# Patient Record
Sex: Female | Born: 1935 | Race: White | Hispanic: No | Marital: Married | Smoking: Never smoker
Health system: Southern US, Community
[De-identification: ages and names within clinical notes are randomized; demographics above are authoritative.]

## PROBLEM LIST (undated history)

## (undated) DIAGNOSIS — I10 Essential (primary) hypertension: Secondary | ICD-10-CM

## (undated) DIAGNOSIS — F0390 Unspecified dementia without behavioral disturbance: Secondary | ICD-10-CM

## (undated) HISTORY — PX: ABDOMINAL HYSTERECTOMY: SHX81

---

## 2003-10-22 ENCOUNTER — Other Ambulatory Visit: Payer: Self-pay

## 2005-02-03 ENCOUNTER — Ambulatory Visit: Payer: Self-pay | Admitting: Family Medicine

## 2005-05-02 ENCOUNTER — Other Ambulatory Visit: Payer: Self-pay

## 2005-05-02 ENCOUNTER — Emergency Department: Payer: Self-pay | Admitting: Unknown Physician Specialty

## 2007-04-15 ENCOUNTER — Other Ambulatory Visit: Payer: Self-pay

## 2007-04-15 ENCOUNTER — Inpatient Hospital Stay: Payer: Self-pay | Admitting: Unknown Physician Specialty

## 2007-06-26 ENCOUNTER — Ambulatory Visit: Payer: Self-pay | Admitting: Family Medicine

## 2008-09-11 ENCOUNTER — Ambulatory Visit: Payer: Self-pay | Admitting: Family Medicine

## 2008-10-02 ENCOUNTER — Ambulatory Visit: Payer: Self-pay | Admitting: Family Medicine

## 2008-10-22 ENCOUNTER — Ambulatory Visit: Payer: Self-pay | Admitting: Family Medicine

## 2010-02-25 ENCOUNTER — Ambulatory Visit: Payer: Self-pay | Admitting: Family Medicine

## 2011-05-25 ENCOUNTER — Ambulatory Visit: Payer: Self-pay | Admitting: Family Medicine

## 2012-05-17 ENCOUNTER — Encounter: Payer: Self-pay | Admitting: Internal Medicine

## 2012-07-26 ENCOUNTER — Ambulatory Visit: Payer: Self-pay | Admitting: Family Medicine

## 2013-07-04 ENCOUNTER — Ambulatory Visit: Payer: Self-pay | Admitting: Family Medicine

## 2014-08-02 ENCOUNTER — Emergency Department: Payer: Self-pay | Admitting: Emergency Medicine

## 2014-08-21 ENCOUNTER — Encounter: Payer: Self-pay | Admitting: Family Medicine

## 2014-09-05 ENCOUNTER — Encounter: Payer: Self-pay | Admitting: Family Medicine

## 2014-10-06 ENCOUNTER — Encounter: Payer: Self-pay | Admitting: Family Medicine

## 2014-11-18 ENCOUNTER — Ambulatory Visit: Payer: Self-pay | Admitting: Family Medicine

## 2015-03-26 ENCOUNTER — Encounter
Admission: RE | Admit: 2015-03-26 | Discharge: 2015-03-26 | Disposition: A | Discharge status: Home / Self Care | Payer: Medicare Other | Source: Ambulatory Visit | Attending: Internal Medicine | Admitting: Internal Medicine

## 2015-04-06 ENCOUNTER — Encounter
Admission: RE | Admit: 2015-04-06 | Discharge: 2015-04-06 | Disposition: A | Discharge status: Home / Self Care | Payer: Medicare Other | Source: Ambulatory Visit | Attending: Internal Medicine | Admitting: Internal Medicine

## 2015-04-15 ENCOUNTER — Ambulatory Visit: Payer: Medicare Other | Attending: Orthopedic Surgery | Admitting: Physical Therapy

## 2015-04-15 DIAGNOSIS — Z96651 Presence of right artificial knee joint: Secondary | ICD-10-CM | POA: Diagnosis present

## 2015-04-15 DIAGNOSIS — R531 Weakness: Secondary | ICD-10-CM | POA: Diagnosis present

## 2015-04-15 DIAGNOSIS — R262 Difficulty in walking, not elsewhere classified: Secondary | ICD-10-CM

## 2015-04-15 NOTE — Patient Instructions (Signed)
All exercises provided were adapted from [email or web address]. Patient was provided a written handout with pictures as described. Any additional cues were manually entered in to handout and copied in to this document.  Mini squats  Stand at countertop and bend knees for a mini squat.      ** Feel a stretch in the knee, remember to start by pushing your butt back first!  [place]  While standing up using a walker, raise your leg out to the side. Keep your knee straight and maintain your toes pointed forward the entire time.   Use your arms for support and balance and have a chair behind you for safety.      ** Can take a break after 5  Knee flexion stretch  Put a towel around your Right Calf and help bend your knee toward you. Hold this position for 30-90 seconds feeling nothing more than a "stretch".

## 2015-04-15 NOTE — Therapy (Signed)
[place] MAIN [facility] SERVICES [address] [place], [place], [address] Phone: [phone]   Fax:  [phone]  Physical Therapy Evaluation  Patient Details  Name: Crystal Burch MRN: [id number] Date of Birth: Apr 25, 1936 Referring Provider:  Merlene Morse, MD  Encounter Date: 04/15/2015      PT End of Session - 04/15/15 1725    Visit Number 1   Number of Visits 17   Date for PT Re-Evaluation 06/10/15   Authorization Type 1   Authorization Time Period 10   PT Start Time 0930   PT Stop Time 1030   PT Time Calculation (min) 60 min   Equipment Utilized During Treatment Gait belt   Activity Tolerance Patient tolerated treatment well;Patient limited by fatigue   Behavior During Therapy [facility] for tasks assessed/performed      No past medical history on file.  No past surgical history on file.  There were no vitals filed for this visit.  Visit Diagnosis:  Difficulty walking - Plan: PT plan of care cert/re-cert  H/O total knee replacement, right - Plan: PT plan of care cert/re-cert  Generalized weakness - Plan: PT plan of care cert/re-cert      Subjective Assessment - 04/15/15 0935    Subjective Patient reports she has poor memory and is a poor historian and thus does not recall specific timelines. Patient reports she had a R TKR roughly 2-3 weeks prior and was dc'd to rehab afterwards. She was dc'd home after 1 week at rehab and has not had PT since. Patient has been home for roughly 5 days prior to this encounter.  She reports using a cane before her surgery.    Patient is accompained by: Family member   Limitations Walking;Standing   Patient Stated Goals "Get it where it won't be stiff and walk without the walker".    Currently in Pain? Yes   Pain Score 4    Pain Location Knee   Pain Orientation Right   Pain Descriptors / Indicators Aching  Stiffness   Pain Type Surgical pain   Pain Onset 1 to 4 weeks ago   Pain Frequency  Intermittent   Aggravating Factors  Weight bearing activivites increase her pain slightly.    Pain Relieving Factors Sitting.             [facility] PT Assessment - 04/15/15 0001    Assessment   Medical Diagnosis Right TKR   Precautions   Precautions Knee;Fall   Restrictions   Weight Bearing Restrictions No   Balance Screen   Has the patient fallen in the past 6 months --  None that she reports.   Has the patient had a decrease in activity level because of a fear of falling?  Yes   [place] residence   Available Help at Discharge --  Husband    Prior Function   Level of Independence Independent with household mobility with device   Cognition   Overall Cognitive Status History of cognitive impairments - at baseline  States she has memory deficits.    Observation/Other Assessments   Lower Extremity Functional Scale  49   Standardized Balance Assessment   Five times sit to stand comments  26.65   10 Meter Walk 16.96   Timed Up and Go Test   Normal TUG (seconds) 25.62   Functional Gait  Assessment   Gait Level Surface Walks 20 ft in less than 7 sec but greater than  5.5 sec, uses assistive device, slower speed, mild gait deviations, or deviates 6-10 in outside of the 12 in walkway width.   Change in Gait Speed Able to change speed, demonstrates mild gait deviations, deviates 6-10 in outside of the 12 in walkway width, or no gait deviations, unable to achieve a major change in velocity, or uses a change in velocity, or uses an assistive device.   Gait with Horizontal Head Turns Performs head turns smoothly with slight change in gait velocity (eg, minor disruption to smooth gait path), deviates 6-10 in outside 12 in walkway width, or uses an assistive device.   Gait with Vertical Head Turns Performs task with slight change in gait velocity (eg, minor disruption to smooth gait path), deviates 6 - 10 in outside 12 in walkway width or uses assistive  device   Gait and Pivot Turn Pivot turns safely in greater than 3 sec and stops with no loss of balance, or pivot turns safely within 3 sec and stops with mild imbalance, requires small steps to catch balance.   Step Over Obstacle Is able to step over one shoe box (4.5 in total height) but must slow down and adjust steps to clear box safely. May require verbal cueing.   Gait with Narrow Base of Support Ambulates less than 4 steps heel to toe or cannot perform without assistance.   Gait with Eyes Closed Walks 20 ft, slow speed, abnormal gait pattern, evidence for imbalance, deviates 10-15 in outside 12 in walkway width. Requires more than 9 sec to ambulate 20 ft.   Ambulating Backwards Walks 20 ft, slow speed, abnormal gait pattern, evidence for imbalance, deviates 10-15 in outside 12 in walkway width.   Steps Alternating feet, must use rail.   Total Score 15     TherEx   Mini squats - to increase her knee flexion via weight bearing and increase her quadricep activity - x 10 with cuing for hip hinging and appropriate knee flexion ROM. Well tolerated.   Standing hip abductions 4 sets x 5 repetitions bilaterally, mildly increased pain though she was able to tolerate and complete with minimal substitutions   Knee flexion stretch - seated with a towel around her malleoli stretch held x 60 seconds on RLE x 3 occasions with cuing for technique                      PT Education - 04/15/15 0939    Education provided Yes   Education Details Patient provided with HEP to increase knee ROM and LE strength   Person(s) Educated Patient;Spouse   Methods Explanation;Demonstration;Handout   Comprehension Returned demonstration;Verbalized understanding             PT Long Term Goals - 04/15/15 1724    PT LONG TERM GOAL #1   Title Patient will be independent with a home exercise program to increase her LE ROM, strength, and endurance to return to safe community mobility by 06/10/2015.    Status New   PT LONG TERM GOAL #2   Title Patient will display at least 110 degrees of knee flexion ROM to increase her ability to negotiate stairs and community obstacles safely by 06/10/2015.    Baseline 95 degrees    Status New   PT LONG TERM GOAL #3   Title Patient will indicate an increase in LEFS by at least 6 points to demonstrate increased functional mobility tolerance by 06/10/2015.    Baseline 49   Status New  PT LONG TERM GOAL #4   Title Patient will complete a 5x sit to stand in less than 15 seconds to demonstrate increased LE strength and power to increase safety with community mobility.    Baseline 26.65 seconds    Status New   PT LONG TERM GOAL #5   Title Patient will complete a TUG with minimally assistive device in less than 15 seconds to demonstrate increased LE strength and power to increase safety with community mobility.   Baseline 25.62 seconds    Status New   Additional Long Term Goals   Additional Long Term Goals Yes   PT LONG TERM GOAL #6   Title Patient will complete a 21m walk in less than 13 seconds to demonstrate increased gait speed to return to community mobility.    Baseline 16.96 seconds    Status New   PT LONG TERM GOAL #7   Title Patient will increase her FGA by at least 3 points to demonstrate increased safety with mobility.    Baseline 15   Status New               Plan - 05/15/2015 0938    Clinical Impression Statement Patient is a 79 y/o female that is s/p R total knee replacement and demonstrates decreased strength, ROM, and functional mobility. Patient demonstrates poor memory and is unable to recall the date of her surgery. Patient fatigues quickly, but is able to complete all selected activities with minimal increases in pain. Patient has many trigger points on her lateral distal thigh, and lacks full ROM both of which would benefit from manual techniques. Patient would benefit from skilled PT services to address her decreased mobility.     Pt will benefit from skilled therapeutic intervention in order to improve on the following deficits Abnormal gait;Decreased balance;Decreased mobility;Decreased strength;Increased edema;Decreased activity tolerance;Decreased endurance;Pain;Decreased range of motion;Difficulty walking   Rehab Potential Fair   Clinical Impairments Affecting Rehab Potential Obvious memory deficits and pain with all mobility.    PT Frequency 2x / week   PT Duration 8 weeks   PT Treatment/Interventions Therapeutic exercise;Balance training;Moist Heat;Cryotherapy;Taping;Manual techniques;Stair training;Gait training;DME Instruction;ADLs/Self Care Home Management;Energy conservation;Neuromuscular re-education   PT Next Visit Plan Progress manual therapy techniques and self stretching to increase knee flexion/extension ROM.    PT Home Exercise Plan See patient instructions.    Consulted and Agree with Plan of Care Patient;Family member/caregiver   Family Member Consulted Spouse           G-Codes - 05/15/2015 1730    Functional Assessment Tool Used 5x sit to stand, TUG, FGA, 82m walk, LEFS   Functional Limitation Mobility: Walking and moving around   Mobility: Walking and Moving Around Current Status [phone]) At least 40 percent but less than 60 percent impaired, limited or restricted   Mobility: Walking and Moving Around Goal Status [id number]) At least 20 percent but less than 40 percent impaired, limited or restricted       Problem List There are no active problems to display for this patient.   Kerman Passey, PT, DPT    May 15, 2015, 5:35 PM  [place] MAIN [facility] SERVICES [address] [place], [place], [address] Phone: [phone]   Fax:  [phone]

## 2015-04-21 ENCOUNTER — Encounter: Payer: Self-pay | Admitting: Physical Therapy

## 2015-04-21 ENCOUNTER — Ambulatory Visit: Payer: Medicare Other | Admitting: Physical Therapy

## 2015-04-21 DIAGNOSIS — R262 Difficulty in walking, not elsewhere classified: Secondary | ICD-10-CM | POA: Diagnosis not present

## 2015-04-21 DIAGNOSIS — Z96651 Presence of right artificial knee joint: Secondary | ICD-10-CM

## 2015-04-21 DIAGNOSIS — R531 Weakness: Secondary | ICD-10-CM

## 2015-04-21 NOTE — Therapy (Signed)
[place] MAIN [facility] SERVICES [address] [place], [place], [address] Phone: [phone]   Fax:  [phone]  Physical Therapy Treatment  Patient Details  Name: Crystal Burch MRN: [id number] Date of Birth: [birth year] Referring Provider:  Merlene Morse, MD  Encounter Date: 04/21/2015      PT End of Session - 04/21/15 1511    Visit Number 2   Number of Visits 17   Date for PT Re-Evaluation 06/10/15   Authorization Type 1   Authorization Time Period 10   Equipment Utilized During Treatment Gait belt   Activity Tolerance Patient tolerated treatment well;Patient limited by fatigue   Behavior During Therapy [facility] for tasks assessed/performed      No past medical history on file.  No past surgical history on file.  There were no vitals filed for this visit.  Visit Diagnosis:  Difficulty walking  H/O total knee replacement, right  Generalized weakness      Subjective Assessment - 04/21/15 1509    Subjective Patient is having some pain with her right knee but she is walking better, and is sleeping in her recliner but she is not able to get comfortable in her bed.    Patient is accompained by: Family member   Limitations Walking;Standing   Patient Stated Goals "Get it where it won't be stiff and walk without the walker".    Currently in Pain? Yes   Pain Score 3    Pain Location Knee   Pain Orientation Right   Pain Type Surgical pain   Pain Onset 1 to 4 weeks ago   Pain Frequency Intermittent       Therapeutic exercise:  Nu-step x 5 minutes LAQ, knee flex with YTB, heel slides, SAQ, SLR, quad sets with 3 sec hold, knee flex in sitting, heel slides , hip abd/add x 20 x 2 and assist with SLR No reports of pain following therapy.                            PT Education - 04/21/15 1510    Education provided Yes   Education Details HEP   Person(s) Educated Patient   Methods Explanation   Comprehension Verbalized understanding             PT Long Term Goals - 04/15/15 1724    PT LONG TERM GOAL #1   Title Patient will be independent with a home exercise program to increase her LE ROM, strength, and endurance to return to safe community mobility by 06/10/2015.   Status New   PT LONG TERM GOAL #2   Title Patient will display at least 110 degrees of knee flexion ROM to increase her ability to negotiate stairs and community obstacles safely by 06/10/2015.    Baseline 95 degrees    Status New   PT LONG TERM GOAL #3   Title Patient will indicate an increase in LEFS by at least 6 points to demonstrate increased functional mobility tolerance by 06/10/2015.    Baseline 49   Status New   PT LONG TERM GOAL #4   Title Patient will complete a 5x sit to stand in less than 15 seconds to demonstrate increased LE strength and power to increase safety with community mobility.    Baseline 26.65 seconds    Status New   PT LONG TERM GOAL #5   Title Patient will complete a TUG with minimally assistive device in less  than 15 seconds to demonstrate increased LE strength and power to increase safety with community mobility.   Baseline 25.62 seconds    Status New   Additional Long Term Goals   Additional Long Term Goals Yes   PT LONG TERM GOAL #6   Title Patient will complete a 39m walk in less than 13 seconds to demonstrate increased gait speed to return to community mobility.    Baseline 16.96 seconds    Status New   PT LONG TERM GOAL #7   Title Patient will increase her FGA by at least 3 points to demonstrate increased safety with mobility.    Baseline 15   Status New               Plan - 04/21/15 1511    Clinical Impression Statement Patient has weakness in RLe and decreased ambulation with RW . She is walking short and intermediate distances with some pain.    Pt will benefit from skilled therapeutic intervention in order to improve on the following deficits Abnormal  gait;Decreased balance;Decreased mobility;Decreased strength;Increased edema;Decreased activity tolerance;Decreased endurance;Pain;Decreased range of motion;Difficulty walking   Rehab Potential Fair   Clinical Impairments Affecting Rehab Potential Obvious memory deficits and pain with all mobility.    PT Frequency 2x / week   PT Duration 8 weeks   PT Treatment/Interventions Therapeutic exercise;Balance training;Moist Heat;Cryotherapy;Taping;Manual techniques;Stair training;Gait training;DME Instruction;ADLs/Self Care Home Management;Energy conservation;Neuromuscular re-education   PT Next Visit Plan Progress manual therapy techniques and self stretching to increase knee flexion/extension ROM.    PT Home Exercise Plan See patient instructions.    Consulted and Agree with Plan of Care Patient;Family member/caregiver   Family Member Consulted Spouse         Problem List There are no active problems to display for this patient.   Alanson Puls 04/21/2015, 3:13 PM  [place] MAIN [facility] SERVICES [address] [place], [place], [address] Phone: [phone]   Fax:  [phone]

## 2015-04-23 ENCOUNTER — Encounter: Payer: Self-pay | Admitting: Physical Therapy

## 2015-04-23 ENCOUNTER — Ambulatory Visit: Payer: Medicare Other | Admitting: Physical Therapy

## 2015-04-23 DIAGNOSIS — R262 Difficulty in walking, not elsewhere classified: Secondary | ICD-10-CM

## 2015-04-23 DIAGNOSIS — R531 Weakness: Secondary | ICD-10-CM

## 2015-04-23 DIAGNOSIS — Z96651 Presence of right artificial knee joint: Secondary | ICD-10-CM

## 2015-04-23 NOTE — Therapy (Signed)
[place] MAIN [facility] SERVICES [address] [place], [place], [address] Phone: [phone]   Fax:  [phone]  Physical Therapy Treatment  Patient Details  Name: Crystal Burch MRN: [id number] Date of Birth: 1936-01-19 Referring Provider:  Merlene Morse, MD  Encounter Date: 04/23/2015      PT End of Session - 04/23/15 1658    Visit Number 3   Number of Visits 17   Date for PT Re-Evaluation 06/10/15   Authorization Type 1   Authorization Time Period 10   PT Start Time 0445   PT Stop Time 0530   PT Time Calculation (min) 45 min   Equipment Utilized During Treatment Gait belt   Activity Tolerance Patient tolerated treatment well;Patient limited by fatigue   Behavior During Therapy [facility] for tasks assessed/performed      History reviewed. No pertinent past medical history.  History reviewed. No pertinent past surgical history.  There were no vitals filed for this visit.  Visit Diagnosis:  Difficulty walking  H/O total knee replacement, right  Generalized weakness      Subjective Assessment - 04/23/15 1657    Subjective Patient says that she is not feeling 100% today. Patient is having 4/10 right knee.   Patient is accompained by: Family member   Limitations Walking;Standing   Patient Stated Goals "Get it where it won't be stiff and walk without the walker".    Currently in Pain? Yes   Pain Score 4    Pain Onset 1 to 4 weeks ago        Therapeutic exercise:  Nu-step x 5 minutes LAQ,  knee flex with YTB,  heel slides,  SAQ, SLR,  quad sets with 3 sec hold,  knee flex in sitting,  heel slides ,  hip abd/add x 20 x 2 and assist with SLR No reports of pain following therapy                          PT Education - 04/23/15 1658    Education provided Yes   Education Details HEP   Person(s) Educated Patient   Methods Explanation   Comprehension Verbalized understanding             PT  Long Term Goals - 04/15/15 1724    PT LONG TERM GOAL #1   Title Patient will be independent with a home exercise program to increase her LE ROM, strength, and endurance to return to safe community mobility by 06/10/2015.   Status New   PT LONG TERM GOAL #2   Title Patient will display at least 110 degrees of knee flexion ROM to increase her ability to negotiate stairs and community obstacles safely by 06/10/2015.    Baseline 95 degrees    Status New   PT LONG TERM GOAL #3   Title Patient will indicate an increase in LEFS by at least 6 points to demonstrate increased functional mobility tolerance by 06/10/2015.    Baseline 49   Status New   PT LONG TERM GOAL #4   Title Patient will complete a 5x sit to stand in less than 15 seconds to demonstrate increased LE strength and power to increase safety with community mobility.    Baseline 26.65 seconds    Status New   PT LONG TERM GOAL #5   Title Patient will complete a TUG with minimally assistive device in less than 15 seconds to demonstrate increased LE strength and  power to increase safety with community mobility.   Baseline 25.62 seconds    Status New   Additional Long Term Goals   Additional Long Term Goals Yes   PT LONG TERM GOAL #6   Title Patient will complete a 20m walk in less than 13 seconds to demonstrate increased gait speed to return to community mobility.    Baseline 16.96 seconds    Status New   PT LONG TERM GOAL #7   Title Patient will increase her FGA by at least 3 points to demonstrate increased safety with mobility.    Baseline 15   Status New               Plan - 04/23/15 1659    Clinical Impression Statement Patient is having some moderate pain in right knee and swelling. She is able to perform all exericses but has slow gait speed and is using RW.    Pt will benefit from skilled therapeutic intervention in order to improve on the following deficits Abnormal gait;Decreased balance;Decreased mobility;Decreased  strength;Increased edema;Decreased activity tolerance;Decreased endurance;Pain;Decreased range of motion;Difficulty walking   Rehab Potential Fair   Clinical Impairments Affecting Rehab Potential Obvious memory deficits and pain with all mobility.    PT Frequency 2x / week   PT Duration 8 weeks   PT Treatment/Interventions Therapeutic exercise;Balance training;Moist Heat;Cryotherapy;Taping;Manual techniques;Stair training;Gait training;DME Instruction;ADLs/Self Care Home Management;Energy conservation;Neuromuscular re-education   PT Next Visit Plan Progress manual therapy techniques and self stretching to increase knee flexion/extension ROM.    PT Home Exercise Plan See patient instructions.    Consulted and Agree with Plan of Care Patient;Family member/caregiver   Family Member Consulted Spouse         Problem List There are no active problems to display for this patient.   Alanson Puls 04/23/2015, 5:00 PM  [place] [address] [place], [place], [address] Phone: [phone]   Fax:  [phone]

## 2015-04-28 ENCOUNTER — Ambulatory Visit: Payer: Medicare Other | Admitting: Physical Therapy

## 2015-04-28 ENCOUNTER — Encounter: Payer: Self-pay | Admitting: Physical Therapy

## 2015-04-28 DIAGNOSIS — R262 Difficulty in walking, not elsewhere classified: Secondary | ICD-10-CM

## 2015-04-28 DIAGNOSIS — Z96651 Presence of right artificial knee joint: Secondary | ICD-10-CM

## 2015-04-28 DIAGNOSIS — R531 Weakness: Secondary | ICD-10-CM

## 2015-04-28 NOTE — Therapy (Signed)
[place] MAIN [facility] SERVICES [address] [place], [place], [address] Phone: [phone]   Fax:  [phone]  Physical Therapy Treatment  Patient Details  Name: Crystal Burch MRN: [id number] Date of Birth: [birth year] Referring Provider:  Merlene Morse, MD  Encounter Date: 04/28/2015      PT End of Session - 04/28/15 1623    Visit Number 4   Number of Visits 17   Date for PT Re-Evaluation 06/10/15   Authorization Type 4   Authorization Time Period 10   PT Start Time 0400   PT Stop Time 0445   PT Time Calculation (min) 45 min   Equipment Utilized During Treatment Gait belt   Activity Tolerance Patient tolerated treatment well;Patient limited by fatigue      History reviewed. No pertinent past medical history.  History reviewed. No pertinent past surgical history.  There were no vitals filed for this visit.  Visit Diagnosis:  Difficulty walking  H/O total knee replacement, right  Generalized weakness      Subjective Assessment - 04/28/15 1622    Subjective . Patient is having 4/10 right knee. Her knee is swollen .   Patient is accompained by: Family member   Limitations Walking;Standing   Patient Stated Goals "Get it where it won't be stiff and walk without the walker".    Pain Score 4    Pain Location Knee   Pain Orientation Right   Pain Onset 1 to 4 weeks ago        Therapeutic exercise:  R knee is 47 cm, L knee is 42.5 cm Nu-step x 5 minutes Leg press for improved motion 60 lbs x 20 x 3 LAQ, x 20  knee flex with YTB, x 20  heel slides x 20  SAQ, x 20 SLR, x 20 quad sets with 3 sec hold, knee flex in sitting,  hip abd/add x 20 x 2   assist with SLR x 20 No reports of increased  pain following therapy.                           PT Education - 04/28/15 1623    Education provided Yes   Education Details HEP   Person(s) Educated Patient   Methods Explanation   Comprehension  Verbalized understanding             PT Long Term Goals - 04/15/15 1724    PT LONG TERM GOAL #1   Title Patient will be independent with a home exercise program to increase her LE ROM, strength, and endurance to return to safe community mobility by 06/10/2015.   Status New   PT LONG TERM GOAL #2   Title Patient will display at least 110 degrees of knee flexion ROM to increase her ability to negotiate stairs and community obstacles safely by 06/10/2015.    Baseline 95 degrees    Status New   PT LONG TERM GOAL #3   Title Patient will indicate an increase in LEFS by at least 6 points to demonstrate increased functional mobility tolerance by 06/10/2015.    Baseline 49   Status New   PT LONG TERM GOAL #4   Title Patient will complete a 5x sit to stand in less than 15 seconds to demonstrate increased LE strength and power to increase safety with community mobility.    Baseline 26.65 seconds    Status New   PT LONG TERM GOAL #5  Title Patient will complete a TUG with minimally assistive device in less than 15 seconds to demonstrate increased LE strength and power to increase safety with community mobility.   Baseline 25.62 seconds    Status New   Additional Long Term Goals   Additional Long Term Goals Yes   PT LONG TERM GOAL #6   Title Patient will complete a 51m walk in less than 13 seconds to demonstrate increased gait speed to return to community mobility.    Baseline 16.96 seconds    Status New   PT LONG TERM GOAL #7   Title Patient will increase her FGA by at least 3 points to demonstrate increased safety with mobility.    Baseline 15   Status New               Plan - 04/28/15 1624    Clinical Impression Statement Patient is having right knee pain , redness and swelling . Sheis able to perform all exericses but has 4/10 pain.    Pt will benefit from skilled therapeutic intervention in order to improve on the following deficits Abnormal gait;Decreased balance;Decreased  mobility;Decreased strength;Increased edema;Decreased activity tolerance;Decreased endurance;Pain;Decreased range of motion;Difficulty walking   Rehab Potential Fair   Clinical Impairments Affecting Rehab Potential Obvious memory deficits and pain with all mobility.    PT Frequency 2x / week   PT Duration 8 weeks   PT Treatment/Interventions Therapeutic exercise;Balance training;Moist Heat;Cryotherapy;Taping;Manual techniques;Stair training;Gait training;DME Instruction;ADLs/Self Care Home Management;Energy conservation;Neuromuscular re-education   PT Next Visit Plan Progress manual therapy techniques and self stretching to increase knee flexion/extension ROM.    PT Home Exercise Plan See patient instructions.    Consulted and Agree with Plan of Care Patient;Family member/caregiver   Family Member Consulted Spouse         Problem List There are no active problems to display for this patient.   Alanson Puls 04/28/2015, 4:31 PM  [place] MAIN [facility] SERVICES [address] [place], [place], [address] Phone: [phone]   Fax:  [phone]

## 2015-04-30 ENCOUNTER — Ambulatory Visit: Payer: Medicare Other | Admitting: Physical Therapy

## 2015-04-30 ENCOUNTER — Encounter: Payer: Self-pay | Admitting: Physical Therapy

## 2015-04-30 DIAGNOSIS — R531 Weakness: Secondary | ICD-10-CM

## 2015-04-30 DIAGNOSIS — R262 Difficulty in walking, not elsewhere classified: Secondary | ICD-10-CM

## 2015-04-30 DIAGNOSIS — Z96651 Presence of right artificial knee joint: Secondary | ICD-10-CM

## 2015-04-30 NOTE — Therapy (Signed)
[place] MAIN [facility] SERVICES [address] [place], [place], [address] Phone: [phone]   Fax:  [phone]  Physical Therapy Treatment  Patient Details  Name: Crystal Burch MRN: [id number] Date of Birth: 06-Feb-1936 Referring Provider:  Merlene Morse, MD  Encounter Date: 04/30/2015      PT End of Session - 04/30/15 1319    Visit Number 5   Number of Visits 17   Date for PT Re-Evaluation 06/10/15   Authorization Type 5   Authorization Time Period 10   PT Start Time 0105   PT Stop Time 0145   PT Time Calculation (min) 40 min   Equipment Utilized During Treatment Gait belt   Activity Tolerance Patient tolerated treatment well;Patient limited by fatigue      History reviewed. No pertinent past medical history.  History reviewed. No pertinent past surgical history.  There were no vitals filed for this visit.  Visit Diagnosis:  Difficulty walking  H/O total knee replacement, right  Generalized weakness      Subjective Assessment - 04/30/15 1318    Subjective . Patient is having 4/10 right knee. Her knee is swollen .She saw the MD today and he told her to put ice on it.    Patient is accompained by: Family member   Limitations Walking;Standing   Patient Stated Goals "Get it where it won't be stiff and walk without the walker".    Pain Score 4    Pain Location Knee   Pain Orientation Right   Pain Onset 1 to 4 weeks ago       Therapeutic exercise:  Nu-step x 5 minutes Standing knee flex into BOSU ball, into raised step x 15  hamstrring stretch and , knee flex stretch x 30 sec LAQ, knee flex with YTB,  heel slides,  SAQ, SLR,  quad sets with 3 sec hold, knee flex in sitting,  heel slides , hip abd/add x 20 x 2 and assist with SLR  pain following therapy is the same as beginning. AROM -10 deg ext to 95 deg flex right knee                           PT Education - 04/30/15 1319    Education provided Yes   Education Details Ice and TED hose   Person(s) Educated Patient   Methods Explanation   Comprehension Verbalized understanding             PT Long Term Goals - 04/15/15 1724    PT LONG TERM GOAL #1   Title Patient will be independent with a home exercise program to increase her LE ROM, strength, and endurance to return to safe community mobility by 06/10/2015.   Status New   PT LONG TERM GOAL #2   Title Patient will display at least 110 degrees of knee flexion ROM to increase her ability to negotiate stairs and community obstacles safely by 06/10/2015.    Baseline 95 degrees    Status New   PT LONG TERM GOAL #3   Title Patient will indicate an increase in LEFS by at least 6 points to demonstrate increased functional mobility tolerance by 06/10/2015.    Baseline 49   Status New   PT LONG TERM GOAL #4   Title Patient will complete a 5x sit to stand in less than 15 seconds to demonstrate increased LE strength and power to increase safety with community mobility.  Baseline 26.65 seconds    Status New   PT LONG TERM GOAL #5   Title Patient will complete a TUG with minimally assistive device in less than 15 seconds to demonstrate increased LE strength and power to increase safety with community mobility.   Baseline 25.62 seconds    Status New   Additional Long Term Goals   Additional Long Term Goals Yes   PT LONG TERM GOAL #6   Title Patient will complete a 20m walk in less than 13 seconds to demonstrate increased gait speed to return to community mobility.    Baseline 16.96 seconds    Status New   PT LONG TERM GOAL #7   Title Patient will increase her FGA by at least 3 points to demonstrate increased safety with mobility.    Baseline 15   Status New               Plan - 04/30/15 1320    Clinical Impression Statement Patient continues to have swelling and pain in R knee. She is ambulating with a RW limited by paain.    Pt will benefit from  skilled therapeutic intervention in order to improve on the following deficits Abnormal gait;Decreased balance;Decreased mobility;Decreased strength;Increased edema;Decreased activity tolerance;Decreased endurance;Pain;Decreased range of motion;Difficulty walking   Rehab Potential Fair   Clinical Impairments Affecting Rehab Potential Obvious memory deficits and pain with all mobility.    PT Frequency 2x / week   PT Duration 8 weeks   PT Treatment/Interventions Therapeutic exercise;Balance training;Moist Heat;Cryotherapy;Taping;Manual techniques;Stair training;Gait training;DME Instruction;ADLs/Self Care Home Management;Energy conservation;Neuromuscular re-education   PT Next Visit Plan Progress manual therapy techniques and self stretching to increase knee flexion/extension ROM.    PT Home Exercise Plan See patient instructions.    Consulted and Agree with Plan of Care Patient;Family member/caregiver   Family Member Consulted Spouse         Problem List There are no active problems to display for this patient.   Arelia Sneddon S 04/30/2015, 1:22 PM  [place] MAIN [facility] SERVICES [address] [place], [place], [address] Phone: [phone]   Fax:  [phone]

## 2015-05-05 ENCOUNTER — Encounter: Payer: Self-pay | Admitting: Physical Therapy

## 2015-05-05 ENCOUNTER — Ambulatory Visit: Payer: Medicare Other | Admitting: Physical Therapy

## 2015-05-05 DIAGNOSIS — R262 Difficulty in walking, not elsewhere classified: Secondary | ICD-10-CM

## 2015-05-05 DIAGNOSIS — Z96651 Presence of right artificial knee joint: Secondary | ICD-10-CM

## 2015-05-05 DIAGNOSIS — R531 Weakness: Secondary | ICD-10-CM

## 2015-05-05 NOTE — Therapy (Signed)
[place] MAIN [facility] SERVICES [address] [place], [place], [address] Phone: [phone]   Fax:  [phone]  Physical Therapy Treatment  Patient Details  Name: Crystal Burch MRN: [id number] Date of Birth: 10-16-1935 Referring Provider:  Merlene Morse, MD  Encounter Date: 05/05/2015      PT End of Session - 05/05/15 1332    Visit Number 6   Number of Visits 17   Date for PT Re-Evaluation 06/10/15   Authorization Type 6   Authorization Time Period 10   PT Start Time 1300   PT Stop Time 1345   PT Time Calculation (min) 45 min   Equipment Utilized During Treatment Gait belt   Activity Tolerance Patient tolerated treatment well;Patient limited by fatigue   Behavior During Therapy [facility] for tasks assessed/performed      History reviewed. No pertinent past medical history.  History reviewed. No pertinent past surgical history.  There were no vitals filed for this visit.  Visit Diagnosis:  Difficulty walking  H/O total knee replacement, right  Generalized weakness      Subjective Assessment - 05/05/15 1302    Subjective Patient reports that she is doing pretty good upon arrival to PT. She states that her pain is 3/10. She continues to be inconsistent with use of TED hose use.    Patient is accompained by: Family member   Limitations Walking;Standing   Patient Stated Goals "Get it where it won't be stiff and walk without the walker".    Currently in Pain? Yes   Pain Score 3    Pain Location Knee   Pain Descriptors / Indicators Aching;Tightness   Pain Type Surgical pain   Pain Onset 1 to 4 weeks ago   Pain Frequency Intermittent   Aggravating Factors  Weight bearing activities increase her pain .    Pain Relieving Factors sitting and resting          Therapeutic exercise:   Nu-step level 3, x 4 minutes (unbilled   Standing forward lunge onto BOSU ball, 2x 15 each LE   Standing calf stretch 2x20 seconds for  bilateral LE.  Standing terminal knee extension yellow tband 2x12 Standing hip abduction x10 each LE yellow tband  Standing hip adduction x10 each LE yellow tband  Standing hip extension x10 each LE yellow tband  Patient requires moderate verbal instruction for proper exercise technique including decreased compensation from trunk, increased erect posture, proper hip and trunk movement to reduce additional compensation. Good response noted from patient from instruction with improve posture and exercise technique.   Seated LAQ 2x12 each LE Seated knee flex with YTB 2x10 ,  each LE Min verbal instruction for proper speed of movement and for increased ROM to enhance strengthening.   Supine with RLE only heel slides 2x 12  Straight Leg raise 2x15 ,   quad sets with 3 sec hold, x15  Min verbal instruction for increased ROM and with increased control of eccentric movement to improve strengthening   Manual  hamstring stretch with hip at 90 degrees flexion,  3x30 seconds knee flex stretch  With hip at 90 degrees flexion, 3x30 sec Knee PROM performed 5x 20 seconds hold in flexion and extension 104 degrees flexion, -6 degrees extension    pain following therapy is the same as beginning. AROM -7 degrees extension, 102 degreed of flexion.                    PT Education -  05/05/15 1332    Education provided Yes   Education Details LE strengthening, increased ROM     Person(s) Educated Patient;Spouse   Methods Explanation;Demonstration;Verbal cues   Comprehension Verbalized understanding;Returned demonstration;Verbal cues required             PT Long Term Goals - 04/15/15 1724    PT LONG TERM GOAL #1   Title Patient will be independent with a home exercise program to increase her LE ROM, strength, and endurance to return to safe community mobility by 06/10/2015.   Status New   PT LONG TERM GOAL #2   Title Patient will display at least 110 degrees of knee flexion ROM to  increase her ability to negotiate stairs and community obstacles safely by 06/10/2015.    Baseline 95 degrees    Status New   PT LONG TERM GOAL #3   Title Patient will indicate an increase in LEFS by at least 6 points to demonstrate increased functional mobility tolerance by 06/10/2015.    Baseline 49   Status New   PT LONG TERM GOAL #4   Title Patient will complete a 5x sit to stand in less than 15 seconds to demonstrate increased LE strength and power to increase safety with community mobility.    Baseline 26.65 seconds    Status New   PT LONG TERM GOAL #5   Title Patient will complete a TUG with minimally assistive device in less than 15 seconds to demonstrate increased LE strength and power to increase safety with community mobility.   Baseline 25.62 seconds    Status New   Additional Long Term Goals   Additional Long Term Goals Yes   PT LONG TERM GOAL #6   Title Patient will complete a 45m walk in less than 13 seconds to demonstrate increased gait speed to return to community mobility.    Baseline 16.96 seconds    Status New   PT LONG TERM GOAL #7   Title Patient will increase her FGA by at least 3 points to demonstrate increased safety with mobility.    Baseline 15   Status New               Plan - 05/05/15 1600    Clinical Impression Statement Patient continues to have swelling in the R knee and in the distal R LE. She was instructed LE strengthening exercises and ROM exercises on this day. PT required to provide moderate verbal instruction for exercise technique for standing exercises to decrease trunk and compensation and proper positioning. Min verbal instruction provided for seated and supine exercises for proper ROM and proper speed of movement. Patient demonstrated very good response to instruction. PT performed PROM of the R knee into flexion and extension. AROM measured at -7 degrees extension and 102 degrees of flexion. Continued skilled PT is recommended to improve  R LE strength, improve standing tolerance, and improve gait to allow improved function with tasks around the home.   Pt will benefit from skilled therapeutic intervention in order to improve on the following deficits Abnormal gait;Decreased balance;Decreased mobility;Decreased strength;Increased edema;Decreased activity tolerance;Decreased endurance;Pain;Decreased range of motion;Difficulty walking   Rehab Potential Fair   Clinical Impairments Affecting Rehab Potential Obvious memory deficits and pain with all mobility.    PT Frequency 2x / week   PT Duration 8 weeks   PT Treatment/Interventions Therapeutic exercise;Balance training;Moist Heat;Cryotherapy;Taping;Manual techniques;Stair training;Gait training;DME Instruction;ADLs/Self Care Home Management;Energy conservation;Neuromuscular re-education   PT Next Visit Plan Progress manual therapy techniques and  self stretching to increase knee flexion/extension ROM.    PT Home Exercise Plan continue as given   Consulted and Agree with Plan of Care Patient;Family member/caregiver   Family Member Consulted Spouse         Problem List There are no active problems to display for this patient.  Barrie Folk SPT 05/06/2015   9:03 AM  This entire session was performed under direct supervision and direction of a licensed therapist. I have personally read, edited and approve of the note as written.  Hopkins,Margaret, PT, DPT 05/06/2015, 9:03 AM  [place] MAIN [facility] SERVICES [address] [place], [place], [address] Phone: [phone]   Fax:  [phone]

## 2015-05-07 ENCOUNTER — Encounter: Payer: Self-pay | Admitting: Physical Therapy

## 2015-05-07 ENCOUNTER — Ambulatory Visit: Payer: Medicare Other | Attending: Orthopedic Surgery | Admitting: Physical Therapy

## 2015-05-07 DIAGNOSIS — R262 Difficulty in walking, not elsewhere classified: Secondary | ICD-10-CM | POA: Diagnosis not present

## 2015-05-07 DIAGNOSIS — R531 Weakness: Secondary | ICD-10-CM | POA: Diagnosis present

## 2015-05-07 DIAGNOSIS — Z96651 Presence of right artificial knee joint: Secondary | ICD-10-CM | POA: Insufficient documentation

## 2015-05-07 NOTE — Therapy (Signed)
[place] MAIN [facility] SERVICES [address] [place], [place], [address] Phone: [phone]   Fax:  [phone]  Physical Therapy Treatment  Patient Details  Name: Crystal Burch MRN: [id number] Date of Birth: [birth year] Referring Provider:  Merlene Morse, MD  Encounter Date: 05/07/2015      PT End of Session - 05/07/15 1644    Visit Number 7   Number of Visits 17   Date for PT Re-Evaluation 06/10/15   Authorization Type 7   Authorization Time Period 10   PT Start Time 1635   PT Stop Time 1704   PT Time Calculation (min) 29 min   Equipment Utilized During Treatment Gait belt   Activity Tolerance Patient tolerated treatment well;Patient limited by fatigue   Behavior During Therapy [facility] for tasks assessed/performed      History reviewed. No pertinent past medical history.  History reviewed. No pertinent past surgical history.  There were no vitals filed for this visit.  Visit Diagnosis:  Difficulty walking  H/O total knee replacement, right  Generalized weakness      Subjective Assessment - 05/07/15 1642    Subjective My knee is feeling a little better today. I had someone come by and put a warm towel with salt around my knee and it seemed to help. I don't feel as stiff today.   Patient is accompained by: Family member   Limitations Walking;Standing   Patient Stated Goals "Get it where it won't be stiff and walk without the walker".    Currently in Pain? Yes   Pain Score 2    Pain Location Knee   Pain Orientation Right   Pain Descriptors / Indicators Aching   Pain Type Surgical pain   Pain Radiating Towards right knee   Pain Onset 1 to 4 weeks ago   Pain Frequency Intermittent   Aggravating Factors  weight bearing activities   Pain Relieving Factors sitting/resting   Effect of Pain on Daily Activities decreased activity         Therapeutic exercise:  Quantum leg press: BLE plate 75# E99, RLE only plate 45#  B71;  Standing on airex: BLE heel/toe raises x12 bilaterally; BLE mini squats x12; BLE alternate march x15;  Seated: RLE LAQ 3 sec hold x10; Long sitting quad set 3 sec hold x10, RLE;    Patient required min-moderate verbal/tactile cues for correct exercise technique including to increase hold time for better strengthening.  PT performed mobilization with movement to RLE: anterior glide to RLE knee with extension 5 sec bouts x10; posterior glide to RLE knee in supine into flexion 5 sec bout x10;  RLE knee AROM in supine: -3 degrees extension, 116 flexion                        PT Education - 05/07/15 1644    Education provided Yes   Education Details LE exercise, strengthening   Person(s) Educated Patient   Methods Explanation;Verbal cues   Comprehension Verbalized understanding;Returned demonstration;Verbal cues required             PT Long Term Goals - 04/15/15 1724    PT LONG TERM GOAL #1   Title Patient will be independent with a home exercise program to increase her LE ROM, strength, and endurance to return to safe community mobility by 06/10/2015.   Status New   PT LONG TERM GOAL #2   Title Patient will display at least 110 degrees of  knee flexion ROM to increase her ability to negotiate stairs and community obstacles safely by 06/10/2015.    Baseline 95 degrees    Status New   PT LONG TERM GOAL #3   Title Patient will indicate an increase in LEFS by at least 6 points to demonstrate increased functional mobility tolerance by 06/10/2015.    Baseline 49   Status New   PT LONG TERM GOAL #4   Title Patient will complete a 5x sit to stand in less than 15 seconds to demonstrate increased LE strength and power to increase safety with community mobility.    Baseline 26.65 seconds    Status New   PT LONG TERM GOAL #5   Title Patient will complete a TUG with minimally assistive device in less than 15 seconds to demonstrate increased LE strength and power  to increase safety with community mobility.   Baseline 25.62 seconds    Status New   Additional Long Term Goals   Additional Long Term Goals Yes   PT LONG TERM GOAL #6   Title Patient will complete a 66m walk in less than 13 seconds to demonstrate increased gait speed to return to community mobility.    Baseline 16.96 seconds    Status New   PT LONG TERM GOAL #7   Title Patient will increase her FGA by at least 3 points to demonstrate increased safety with mobility.    Baseline 15   Status New               Plan - 05/07/15 1706    Clinical Impression Statement Patient instructed in RLE knee strengthening, particularly knee extension. Patient responded well to cues. She was able to demonstrate better hold time for quad strengthening. She also responded well to manual therapy with improved RLE knee ROM. Patient would benefit from additional skilled PT intervention to improve ROM/strength and reduce knee pain for better tolerance with ADLs.   Pt will benefit from skilled therapeutic intervention in order to improve on the following deficits Abnormal gait;Decreased balance;Decreased mobility;Decreased strength;Increased edema;Decreased activity tolerance;Decreased endurance;Pain;Decreased range of motion;Difficulty walking   Rehab Potential Fair   Clinical Impairments Affecting Rehab Potential Obvious memory deficits and pain with all mobility.    PT Frequency 2x / week   PT Duration 8 weeks   PT Treatment/Interventions Therapeutic exercise;Balance training;Moist Heat;Cryotherapy;Taping;Manual techniques;Stair training;Gait training;DME Instruction;ADLs/Self Care Home Management;Energy conservation;Neuromuscular re-education   PT Next Visit Plan Progress manual therapy techniques and self stretching to increase knee flexion/extension ROM.    PT Home Exercise Plan continue as given   Consulted and Agree with Plan of Care Patient;Family member/caregiver   Family Member Consulted Spouse          Problem List There are no active problems to display for this patient.   Hopkins,Marquasha Brutus PT, DPT 05/07/2015, 5:08 PM  [place] MAIN [facility] SERVICES [address] [place], [place], [address] Phone: [phone]   Fax:  [phone]

## 2015-05-12 ENCOUNTER — Encounter: Payer: Medicare Other | Admitting: Physical Therapy

## 2015-05-13 ENCOUNTER — Ambulatory Visit: Payer: Medicare Other

## 2015-05-13 ENCOUNTER — Encounter: Payer: Medicare Other | Admitting: Physical Therapy

## 2015-05-13 DIAGNOSIS — R262 Difficulty in walking, not elsewhere classified: Secondary | ICD-10-CM | POA: Diagnosis not present

## 2015-05-13 DIAGNOSIS — R531 Weakness: Secondary | ICD-10-CM

## 2015-05-13 DIAGNOSIS — Z96651 Presence of right artificial knee joint: Secondary | ICD-10-CM

## 2015-05-13 NOTE — Patient Instructions (Signed)
[email or web address] R gastroc stretch 3x30 sec

## 2015-05-13 NOTE — Therapy (Signed)
[place] MAIN [facility] SERVICES [address] [place], [place], [address] Phone: [phone]   Fax:  [phone]  Physical Therapy Treatment  Patient Details  Name: Crystal Burch MRN: [id number] Date of Birth: 07-09-1936 Referring Provider:  Merlene Morse, MD  Encounter Date: 05/13/2015      PT End of Session - 05/13/15 1451    Visit Number 8   Number of Visits 17   Date for PT Re-Evaluation 06/10/15   Authorization Type 8   Authorization Time Period 10   PT Start Time 1340   PT Stop Time 1430   PT Time Calculation (min) 50 min   Equipment Utilized During Treatment Gait belt   Activity Tolerance Patient tolerated treatment well;Patient limited by fatigue   Behavior During Therapy [facility] for tasks assessed/performed      History reviewed. No pertinent past medical history.  History reviewed. No pertinent past surgical history.  There were no vitals filed for this visit.  Visit Diagnosis:  Difficulty walking  H/O total knee replacement, right  Generalized weakness      Subjective Assessment - 05/13/15 1441    Subjective Pt relates she has been doing and has 2/10 right knee pain.  Pt relates she has been doing some of her HEP and focusing on the ones that flex her knee to improve her knee flexion ROM.     Patient is accompained by: Family member   Limitations Walking;Standing   Patient Stated Goals "Get it where it won't be stiff and walk without the walker".    Currently in Pain? Yes   Pain Score 2    Pain Location Knee   Pain Orientation Left   Pain Onset 1 to 4 weeks ago     there ex: Nustep x 5 min (no charge) level 2 Standing gastroc stretch 3x30 sec Pt required verbal, visual and tactile cueing for correct technique, especially keeping heel on the ground  Manual therapy: Patellar mobs grade 3, 3x30 sec in all directions Tibia/femur mobilizations AP/PA grade 2-3,  2x 30 sec Soft tissue massage to R calf,  hamstrings distal to proximal  MET to hamstring and calf PT noted decreased tension, myofacial trigger point restrictions after manual therapy  Pre treatment AROM:6-104  Post treatment AROM: 3-104                            PT Education - 05/13/15 1450    Education provided Yes   Education Details the benefits of soft tissue mobilization, HEP (gastroc strech, and having right leg proped up on chair with weight on knee to promote knee extension)   Person(s) Educated Patient   Methods Explanation;Demonstration   Comprehension Verbalized understanding;Returned demonstration             PT Long Term Goals - 04/15/15 1724    PT LONG TERM GOAL #1   Title Patient will be independent with a home exercise program to increase her LE ROM, strength, and endurance to return to safe community mobility by 06/10/2015.   Status New   PT LONG TERM GOAL #2   Title Patient will display at least 110 degrees of knee flexion ROM to increase her ability to negotiate stairs and community obstacles safely by 06/10/2015.    Baseline 95 degrees    Status New   PT LONG TERM GOAL #3   Title Patient will indicate an increase in LEFS by at least 6  points to demonstrate increased functional mobility tolerance by 06/10/2015.    Baseline 49   Status New   PT LONG TERM GOAL #4   Title Patient will complete a 5x sit to stand in less than 15 seconds to demonstrate increased LE strength and power to increase safety with community mobility.    Baseline 26.65 seconds    Status New   PT LONG TERM GOAL #5   Title Patient will complete a TUG with minimally assistive device in less than 15 seconds to demonstrate increased LE strength and power to increase safety with community mobility.   Baseline 25.62 seconds    Status New   Additional Long Term Goals   Additional Long Term Goals Yes   PT LONG TERM GOAL #6   Title Patient will complete a 51mwalk in less than 13 seconds to demonstrate  increased gait speed to return to community mobility.    Baseline 16.96 seconds    Status New   PT LONG TERM GOAL #7   Title Patient will increase her FGA by at least 3 points to demonstrate increased safety with mobility.    Baseline 15   Status New               Plan - 05/13/15 1459    Clinical Impression Statement Pt presents with hypomobile patella and tibiofemoral joint and myofascial trigger point restrictions in her right calf and hamstring.  Pt responded well to manual therapy with improved ROM.  Pt educated calf stretching and finding positions that promote knee extension versus knee flexion, for example avoiding prolonged time lying on recliner with knee flexed.     Pt will benefit from skilled therapeutic intervention in order to improve on the following deficits Abnormal gait;Decreased balance;Decreased mobility;Decreased strength;Increased edema;Decreased activity tolerance;Decreased endurance;Pain;Decreased range of motion;Difficulty walking   Rehab Potential Fair   Clinical Impairments Affecting Rehab Potential Obvious memory deficits and pain with all mobility.    PT Frequency 2x / week   PT Duration 8 weeks   PT Treatment/Interventions Therapeutic exercise;Balance training;Moist Heat;Cryotherapy;Taping;Manual techniques;Stair training;Gait training;DME Instruction;ADLs/Self Care Home Management;Energy conservation;Neuromuscular re-education   PT Next Visit Plan Progress manual therapy techniques and self stretching to increase knee flexion/extension ROM.    PT Home Exercise Plan calf stretch    Consulted and Agree with Plan of Care Patient;Family member/caregiver   Family Member Consulted Spouse         Problem List There are no active problems to display for this patient.  JRenford Dills SPT This entire session was performed under direct supervision and direction of a licensed tChiropractor. I have personally read, edited and approve of the note  as written. AGorden Harms Tortorici, PT, DPT #[id number] Tortorici,Ashley 05/13/2015, 4:19 PM  [place] [facility] [address] [place] [address]Phone: [phone]  Fax:  [phone]

## 2015-05-14 ENCOUNTER — Encounter: Payer: Medicare Other | Admitting: Physical Therapy

## 2015-05-19 ENCOUNTER — Ambulatory Visit: Payer: Medicare Other | Admitting: Physical Therapy

## 2015-05-19 ENCOUNTER — Encounter: Payer: Self-pay | Admitting: Physical Therapy

## 2015-05-19 DIAGNOSIS — Z96651 Presence of right artificial knee joint: Secondary | ICD-10-CM

## 2015-05-19 DIAGNOSIS — R262 Difficulty in walking, not elsewhere classified: Secondary | ICD-10-CM

## 2015-05-19 DIAGNOSIS — R531 Weakness: Secondary | ICD-10-CM

## 2015-05-19 NOTE — Therapy (Signed)
[place] MAIN [facility] SERVICES [address] [place], [place], [address] Phone: [phone]   Fax:  [phone]  Physical Therapy Treatment  Patient Details  Name: Crystal Burch MRN: [id number] Date of Birth: 19-Oct-1935 Referring Provider:  Merlene Morse, MD  Encounter Date: 05/19/2015      PT End of Session - 05/19/15 1618    Visit Number 9   Number of Visits 17   Date for PT Re-Evaluation 06/10/15   Authorization Type 9   Authorization Time Period 10   PT Start Time 0400   PT Stop Time 0440   PT Time Calculation (min) 40 min   Equipment Utilized During Treatment Gait belt   Activity Tolerance Patient tolerated treatment well;Patient limited by fatigue   Behavior During Therapy [facility] for tasks assessed/performed      History reviewed. No pertinent past medical history.  History reviewed. No pertinent past surgical history.  There were no vitals filed for this visit.  Visit Diagnosis:  Difficulty walking  H/O total knee replacement, right  Generalized weakness      Subjective Assessment - 05/19/15 1615    Subjective Christabel is doing well and needs to work on her extensin.    Patient is accompained by: Family member   Limitations Walking;Standing   Patient Stated Goals "Get it where it won't be stiff and walk without the walker".    Currently in Pain? Yes   Pain Score 2    Pain Location Knee   Pain Orientation Left   Pain Onset 1 to 4 weeks ago     Therapeutic exercise:  Hamstring stretches x 30 sec x 3 Standing with RTB 3 way RLE Step ups  Lunges into BOSU ball x 20 Raising up her right knee in straight path Supine over bolster and quad sets with 3 sec hold Supine over bolster and 10 lb weight over knee x 1 minutes Supine hamstring stretch with overpressure                            PT Education - 05/19/15 1617    Education provided Yes   Education Details knee extension HEP exericses   Person(s) Educated Patient   Methods Explanation   Comprehension Verbalized understanding             PT Long Term Goals - 04/15/15 1724    PT LONG TERM GOAL #1   Title Patient will be independent with a home exercise program to increase her LE ROM, strength, and endurance to return to safe community mobility by 06/10/2015.   Status New   PT LONG TERM GOAL #2   Title Patient will display at least 110 degrees of knee flexion ROM to increase her ability to negotiate stairs and community obstacles safely by 06/10/2015.    Baseline 95 degrees    Status New   PT LONG TERM GOAL #3   Title Patient will indicate an increase in LEFS by at least 6 points to demonstrate increased functional mobility tolerance by 06/10/2015.    Baseline 49   Status New   PT LONG TERM GOAL #4   Title Patient will complete a 5x sit to stand in less than 15 seconds to demonstrate increased LE strength and power to increase safety with community mobility.    Baseline 26.65 seconds    Status New   PT LONG TERM GOAL #5   Title Patient will complete a  TUG with minimally assistive device in less than 15 seconds to demonstrate increased LE strength and power to increase safety with community mobility.   Baseline 25.62 seconds    Status New   Additional Long Term Goals   Additional Long Term Goals Yes   PT LONG TERM GOAL #6   Title Patient will complete a 16m walk in less than 13 seconds to demonstrate increased gait speed to return to community mobility.    Baseline 16.96 seconds    Status New   PT LONG TERM GOAL #7   Title Patient will increase her FGA by at least 3 points to demonstrate increased safety with mobility.    Baseline 15   Status New               Plan - 05/19/15 1618    Clinical Impression Statement Patient continues to have pain and swelling in R knee. She lacks extesnion in her right knee. She is able to perform exercises without increasaed pain.    Pt will benefit from skilled  therapeutic intervention in order to improve on the following deficits Abnormal gait;Decreased balance;Decreased mobility;Decreased strength;Increased edema;Decreased activity tolerance;Decreased endurance;Pain;Decreased range of motion;Difficulty walking   Rehab Potential Fair   Clinical Impairments Affecting Rehab Potential Obvious memory deficits and pain with all mobility.    PT Frequency 2x / week   PT Duration 8 weeks   PT Treatment/Interventions Therapeutic exercise;Balance training;Moist Heat;Cryotherapy;Taping;Manual techniques;Stair training;Gait training;DME Instruction;ADLs/Self Care Home Management;Energy conservation;Neuromuscular re-education   PT Next Visit Plan Progress manual therapy techniques and self stretching to increase knee flexion/extension ROM.    PT Home Exercise Plan calf stretch    Consulted and Agree with Plan of Care Patient;Family member/caregiver   Family Member Consulted Spouse         Problem List There are no active problems to display for this patient.   Alanson Puls 05/19/2015, 5:15 PM  [place] MAIN [facility] SERVICES [address] [place], [place], [address] Phone: [phone]   Fax:  [phone]

## 2015-05-21 ENCOUNTER — Ambulatory Visit: Payer: Medicare Other | Admitting: Physical Therapy

## 2015-05-26 ENCOUNTER — Ambulatory Visit: Payer: Medicare Other | Admitting: Physical Therapy

## 2015-05-26 ENCOUNTER — Encounter: Payer: Self-pay | Admitting: Physical Therapy

## 2015-05-26 DIAGNOSIS — R262 Difficulty in walking, not elsewhere classified: Secondary | ICD-10-CM

## 2015-05-26 DIAGNOSIS — Z96651 Presence of right artificial knee joint: Secondary | ICD-10-CM

## 2015-05-26 DIAGNOSIS — R531 Weakness: Secondary | ICD-10-CM

## 2015-05-26 NOTE — Therapy (Signed)
[place] MAIN [facility] SERVICES [address] [place], [place], [address] Phone: [phone]   Fax:  [phone]  Physical Therapy Treatment  Patient Details  Name: Crystal Burch MRN: [id number] Date of Birth: [birth year] Referring Provider:  Merlene Morse, MD  Encounter Date: 05/26/2015      PT End of Session - 05/26/15 1607    Visit Number 10   Number of Visits 17   Date for PT Re-Evaluation 06/10/15   Authorization Type 10   Authorization Time Period 10   PT Start Time 0400   PT Stop Time 0445   PT Time Calculation (min) 45 min   Equipment Utilized During Treatment Gait belt   Activity Tolerance Patient tolerated treatment well;Patient limited by fatigue   Behavior During Therapy [facility] for tasks assessed/performed      History reviewed. No pertinent past medical history.  History reviewed. No pertinent past surgical history.  There were no vitals filed for this visit.  Visit Diagnosis:  Difficulty walking  H/O total knee replacement, right  Generalized weakness      Subjective Assessment - 05/26/15 1606    Subjective Lil is doing well and needs to work on her extensin.    Patient is accompained by: Family member   Limitations Walking;Standing   Patient Stated Goals "Get it where it won't be stiff and walk without the walker".    Currently in Pain? Yes   Pain Score 2    Pain Onset 1 to 4 weeks ago           Outcome measures;  TUG: 20.66 5 x sit to stand: 19.64 sec 10 MW : . 67 sec  manual  therapy including foam roller for STM to right hamstring in prone Prone lying and stretching hamstirng Knee extension exercises to promote full extension in  Right kinee . Standing knee extension with theraball x 20  Gait training with emphasis on terminal knee extension during stance phase of gait. Patient needs occasional verbal cueing to improve posture and cueing to correctly perform exercises slowly, holding at end of  range to increase motor firing of desired muscle to encourage fatigue.                        PT Education - 05/26/15 1606    Education provided Yes   Education Details knee extenxion exercises   Person(s) Educated Patient   Methods Explanation   Comprehension Verbalized understanding             PT Long Term Goals - 05/26/15 1617    PT LONG TERM GOAL #1   Status Achieved   PT LONG TERM GOAL #2   Status Achieved   PT LONG TERM GOAL #3   Status Partially Met   PT LONG TERM GOAL #4   Status Partially Met   PT LONG TERM GOAL #5   Status Partially Met   PT LONG TERM GOAL #6   Status Partially Met   PT LONG TERM GOAL #7   Status Partially Met               Plan - 05/26/15 1607    Clinical Impression Statement Patient performs all exericses without increased pain and is improving with her outcome measures. She continues to have lack of extension.    Pt will benefit from skilled therapeutic intervention in order to improve on the following deficits Abnormal gait;Decreased balance;Decreased mobility;Decreased strength;Increased edema;Decreased activity  tolerance;Decreased endurance;Pain;Decreased range of motion;Difficulty walking   Rehab Potential Fair   Clinical Impairments Affecting Rehab Potential Obvious memory deficits and pain with all mobility.    PT Frequency 2x / week   PT Duration 8 weeks   PT Treatment/Interventions Therapeutic exercise;Balance training;Moist Heat;Cryotherapy;Taping;Manual techniques;Stair training;Gait training;DME Instruction;ADLs/Self Care Home Management;Energy conservation;Neuromuscular re-education   PT Next Visit Plan Progress manual therapy techniques and self stretching to increase knee flexion/extension ROM.    PT Home Exercise Plan calf stretch    Consulted and Agree with Plan of Care Patient;Family member/caregiver   Family Member Consulted Spouse           G-Codes - 2015/06/09 1617    Functional Assessment  Tool Used 5x sit to stand, TUG, FGA, 24mwalk, LEFS   Functional Limitation Mobility: Walking and moving around   Mobility: Walking and Moving Around Current Status [phone] At least 20 percent but less than 40 percent impaired, limited or restricted   Mobility: Walking and Moving Around Goal Status [phone] At least 1 percent but less than 20 percent impaired, limited or restricted      Problem List There are no active problems to display for this patient.   MAlanson Puls904-Oct-2016 4:23 PM  [place]MAIN [facility] IncSERVICES [address] [place] [address]Phone: [phone]  Fax:  [phone]

## 2015-05-27 ENCOUNTER — Ambulatory Visit: Payer: Medicare Other | Admitting: Physical Therapy

## 2015-05-28 ENCOUNTER — Ambulatory Visit: Payer: Medicare Other | Admitting: Physical Therapy

## 2015-06-03 ENCOUNTER — Ambulatory Visit: Payer: Medicare Other

## 2015-06-03 DIAGNOSIS — R531 Weakness: Secondary | ICD-10-CM

## 2015-06-03 DIAGNOSIS — R262 Difficulty in walking, not elsewhere classified: Secondary | ICD-10-CM

## 2015-06-03 DIAGNOSIS — Z96651 Presence of right artificial knee joint: Secondary | ICD-10-CM

## 2015-06-03 NOTE — Therapy (Signed)
[place] MAIN [facility] SERVICES [address] [place], [place], [address] Phone: [phone]   Fax:  [phone]  Physical Therapy Treatment  Patient Details  Name: Crystal Burch MRN: [id number] Date of Birth: May 13, 1936 Referring Provider:  Lavera Guise, MD  Encounter Date: 06/03/2015      PT End of Session - 06/03/15 1627    Visit Number 11   Number of Visits 17   Date for PT Re-Evaluation 06/10/15   Authorization Type 1   Authorization Time Period 10   PT Start Time 1345   PT Stop Time 1430   PT Time Calculation (min) 45 min   Equipment Utilized During Treatment Gait belt   Activity Tolerance Patient tolerated treatment well   Behavior During Therapy [facility] for tasks assessed/performed      History reviewed. No pertinent past medical history.  History reviewed. No pertinent past surgical history.  There were no vitals filed for this visit.  Visit Diagnosis:  Difficulty walking  H/O total knee replacement, right  Generalized weakness      Subjective Assessment - 06/03/15 1626    Subjective Pt relates she is feeling better but is still sick.  Pt notes tightness in the posterior aspect of her right knee and currently has 2/10 pain.    Patient is accompained by: Family member   Limitations Walking;Standing   Patient Stated Goals "Get it where it won't be stiff and walk without the walker".    Pain Score 2    Pain Location Knee   Pain Orientation Right   Pain Onset 1 to 4 weeks ago         there ex: Standing gastroc stretch 3x30 sec Standing hamstring stretch 3x30 sec Side stepping with yellow band above knees 4x10 steps with hand held assist R TKE with red band 2x10 Right leg press with 45# 2x10 Double leg press with 60#x10 Pt required verbal, visual and tactile cueing for correct technique,   Manual therapy: Soft tissue mobilization (muscle stripping, ischemic compression effleurage) to R calf, hamstrings distal to  proximal   SPT noted decreased tension, myofacial trigger point restrictions after manual therapy  Post treatment pt was lacking 4 degrees of knee extension.                           PT Education - 06/03/15 1626    Education provided Yes   Education Details plan of care and sitting with small weight on her knee to promote knee extension at home   Person(s) Educated Patient   Methods Explanation   Comprehension Verbalized understanding             PT Long Term Goals - 05/26/15 1617    PT LONG TERM GOAL #1   Status Achieved   PT LONG TERM GOAL #2   Status Achieved   PT LONG TERM GOAL #3   Status Partially Met   PT LONG TERM GOAL #4   Status Partially Met   PT LONG TERM GOAL #5   Status Partially Met   PT LONG TERM GOAL #6   Status Partially Met   PT LONG TERM GOAL #7   Status Partially Met               Plan - 06/03/15 1629    Clinical Impression Statement pt has increased tightness and has myofascial trigger points in her right calf and hamstring which improved with  manual therapy.  pt was able to complete session without an increase in pain in her right knee.  Pt lacks 4 degrees of knee extension actively in supine.    Pt will benefit from skilled therapeutic intervention in order to improve on the following deficits Abnormal gait;Decreased balance;Decreased mobility;Decreased strength;Increased edema;Decreased activity tolerance;Decreased endurance;Pain;Decreased range of motion;Difficulty walking   Rehab Potential Fair   Clinical Impairments Affecting Rehab Potential Obvious memory deficits and pain with all mobility.    PT Frequency 2x / week   PT Duration 8 weeks   PT Treatment/Interventions Therapeutic exercise;Balance training;Moist Heat;Cryotherapy;Taping;Manual techniques;Stair training;Gait training;DME Instruction;ADLs/Self Care Home Management;Energy conservation;Neuromuscular re-education   PT Next Visit Plan Progress manual  therapy techniques and self stretching to increase knee flexion/extension ROM.    Consulted and Agree with Plan of Care Patient;Family member/caregiver   Family Member Consulted Spouse         Problem List There are no active problems to display for this patient.  Renford Dills, SPT This entire session was performed under direct supervision and direction of a licensed Chiropractor . I have personally read, edited and approve of the note as written. Gorden Harms. Tortorici, PT, DPT [phone]  Tortorici,Ashley 06/03/2015, 6:08 PM  [place] MAIN [facility] SERVICES [address] [place], [place], [address] Phone: [phone]   Fax:  [phone]

## 2015-06-09 ENCOUNTER — Ambulatory Visit: Payer: Medicare Other | Attending: Orthopedic Surgery | Admitting: Physical Therapy

## 2015-06-09 ENCOUNTER — Encounter: Payer: Self-pay | Admitting: Physical Therapy

## 2015-06-09 DIAGNOSIS — Z96651 Presence of right artificial knee joint: Secondary | ICD-10-CM | POA: Insufficient documentation

## 2015-06-09 DIAGNOSIS — R262 Difficulty in walking, not elsewhere classified: Secondary | ICD-10-CM | POA: Insufficient documentation

## 2015-06-09 DIAGNOSIS — R531 Weakness: Secondary | ICD-10-CM | POA: Diagnosis present

## 2015-06-10 NOTE — Therapy (Signed)
[place] MAIN [facility] SERVICES [address] [place], [place], [address] Phone: [phone]   Fax:  [phone]  Physical Therapy Treatment  Patient Details  Name: Crystal Burch MRN: [id number] Date of Birth: 03-12-1936 Referring Provider:  Lavera Guise, MD  Encounter Date: 06/09/2015      PT End of Session - 06/10/15 0751    Visit Number 12   Number of Visits 17   Date for PT Re-Evaluation 06/10/15   Authorization Type 2   Authorization Time Period 10   PT Start Time 1645   PT Stop Time 1730   PT Time Calculation (min) 45 min   Equipment Utilized During Treatment Gait belt   Activity Tolerance Patient tolerated treatment well   Behavior During Therapy [facility] for tasks assessed/performed      History reviewed. No pertinent past medical history.  History reviewed. No pertinent past surgical history.  There were no vitals filed for this visit.  Visit Diagnosis:  Difficulty walking  H/O total knee replacement, right  Generalized weakness      Subjective Assessment - 06/09/15 1653    Subjective Patient reports that she is doing well today. She states that she was sick last week but [place] feeling musch better now. She states that she has a little stiffness in the knee. 2/10    Patient is accompained by: Family member   Limitations Walking;Standing   Patient Stated Goals "Get it where it won't be stiff and walk without the walker".    Currently in Pain? Yes   Pain Score 2    Pain Location Knee   Pain Orientation Right   Pain Descriptors / Indicators Aching   Pain Type Surgical pain   Pain Onset 1 to 4 weeks ago   Pain Frequency Intermittent            Nustep level 3, 3 minutes   Supine Quad set 2x10 with slight overpressure from PT to increase Knee extension   Calf stretch 3x20   Terminal knee extension 2x12 green tband  Step ups to 6 inch step BLE, 1 HHA x10   Standing therex with red tband  Hip flexion x12 BLE  Hip  extension x12 BLE  Hip Abduction x12  BLE   PT provided CGA with step ups to increase safety. Min verbal instruction for proper exercise position, increased R terminal knee extension, improved weight bearing on the R LE, and improved posture. Patient responded very well to instruction.    Gait training  17f, on tile small based quad cane  921f on tile small based quad cane  14056fn carpet, small based quad cane.  PT provided CGA- supervision assist with gait training to increase patient safety. Moderate verbal instruction provided to improve BLE step length, increased stance time on the R LE, increased R heel strike and terminal knee extension. Patient responded well to verbal instruction with improve heel contact and also demonstrated improve posture with quad cane compared to gait with RW.                        PT Education - 06/10/15 0750    Education provided Yes   Education Details Gait training, LE strengthening to improve R LE knee extension    Person(s) Educated Patient;Spouse   Methods Explanation;Demonstration;Verbal cues   Comprehension Verbalized understanding;Returned demonstration;Verbal cues required             PT Long Term  Goals - 06/09/15 0759    PT LONG TERM GOAL #1   Title Patient will be independent with a home exercise program to increase her LE ROM, strength, and endurance to return to safe community mobility by 06/10/2015.   Status Achieved   PT LONG TERM GOAL #2   Title Patient will display at least 110 degrees of knee flexion ROM to increase her ability to negotiate stairs and community obstacles safely by 06/10/2015.    Baseline 95 degrees    Status Achieved   PT LONG TERM GOAL #3   Title Patient will indicate an increase in LEFS by at least 6 points to demonstrate increased functional mobility tolerance by 06/10/2015.    Baseline 49   Status Partially Met   PT LONG TERM GOAL #4   Title Patient will complete a 5x sit to stand in less  than 15 seconds to demonstrate increased LE strength and power to increase safety with community mobility.    Baseline 26.65 seconds    Status Partially Met   PT LONG TERM GOAL #5   Title Patient will complete a TUG with minimally assistive device in less than 15 seconds to demonstrate increased LE strength and power to increase safety with community mobility.   Baseline 25.62 seconds    Status Partially Met   PT LONG TERM GOAL #6   Title Patient will complete a 39mwalk in less than 13 seconds to demonstrate increased gait speed to return to community mobility.    Baseline 16.96 seconds    Status Partially Met   PT LONG TERM GOAL #7   Title Patient will increase her FGA by at least 3 points to demonstrate increased safety with mobility.    Baseline 15   Status Partially Met               Plan - 06/10/15 0751    Clinical Impression Statement Patient instructed in LE strengthening and gait training. PT provided Min verbal instruction with therex for improved weight bearing on the R LE and increased terminal knee extension. Gait training performed with small based quad cane on various surfaces. With moderate instruction from PT, patient was able to demonstrate improved R LE terminal knee extension, improve heel strike, and increased stance time on the R LE. She also demonstrated improve posture with gait using quad cane compared to gait with RW. Patient instructed to increase use of quad cane for short distances with gait. Continued skilled PT is recommended to improve ROM and strength, as well as improve gait to allow patient to return to PLOF.   Pt will benefit from skilled therapeutic intervention in order to improve on the following deficits Abnormal gait;Decreased balance;Decreased mobility;Decreased strength;Increased edema;Decreased activity tolerance;Decreased endurance;Pain;Decreased range of motion;Difficulty walking   Rehab Potential Fair   Clinical Impairments Affecting Rehab  Potential Obvious memory deficits and pain with all mobility.    PT Frequency 2x / week   PT Duration 8 weeks   PT Treatment/Interventions Therapeutic exercise;Balance training;Moist Heat;Cryotherapy;Taping;Manual techniques;Stair training;Gait training;DME Instruction;ADLs/Self Care Home Management;Energy conservation;Neuromuscular re-education   PT Next Visit Plan manual, gait training, strengthening.    PT Home Exercise Plan continue as given    Consulted and Agree with Plan of Care Patient;Family member/caregiver   Family Member Consulted Spouse         Problem List There are no active problems to display for this patient.  ABarrie FolkSPT 06/10/2015   2:27 PM  This entire session was performed  under direct supervision and direction of a licensed therapist . I have personally read, edited and approve of the note as written.  Hopkins,Margaret  PT, DPT  06/10/2015, 2:27 PM  [place] MAIN [facility] SERVICES [address] [place], [place], [address] Phone: [phone]   Fax:  [phone]

## 2015-06-11 ENCOUNTER — Ambulatory Visit: Payer: Medicare Other

## 2015-06-11 DIAGNOSIS — Z96651 Presence of right artificial knee joint: Secondary | ICD-10-CM

## 2015-06-11 DIAGNOSIS — R262 Difficulty in walking, not elsewhere classified: Secondary | ICD-10-CM | POA: Diagnosis not present

## 2015-06-11 DIAGNOSIS — R531 Weakness: Secondary | ICD-10-CM

## 2015-06-11 NOTE — Patient Instructions (Signed)
[email or web address] Calf stretch 3x30 sec

## 2015-06-11 NOTE — Therapy (Addendum)
[place] MAIN [facility] SERVICES [address] [place], [place], [address] Phone: [phone]   Fax:  [phone]  Physical Therapy Treatment  Patient Details  Name: Crystal Burch MRN: [id number] Date of Birth: [birth year] Referring Provider:  Merlene Morse, MD  Encounter Date: 06/11/2015      PT End of Session - 06/18/15 1617    Visit Number 15   Number of Visits 17   Date for PT Re-Evaluation 06/10/15   Authorization Type 5   Authorization Time Period 10   PT Start Time 1600   PT Stop Time 1645   PT Time Calculation (min) 45 min   Equipment Utilized During Treatment Gait belt   Activity Tolerance Patient tolerated treatment well   Behavior During Therapy [facility] for tasks assessed/performed      History reviewed. No pertinent past medical history.  History reviewed. No pertinent past surgical history.  There were no vitals filed for this visit.  Visit Diagnosis:  Difficulty walking - Plan: PT plan of care cert/re-cert  H/O total knee replacement, right - Plan: PT plan of care cert/re-cert  Generalized weakness - Plan: PT plan of care cert/re-cert      Subjective Assessment - 06/18/15 1615    Subjective Patient is now using small based quad cane. She states that she went to the hospital in [facility] and was kept overnight for observation. The MD discharged her with Espogeal irritation.    Patient is accompained by: Family member   Limitations Walking;Standing   Patient Stated Goals "Get it where it won't be stiff and walk without the walker".    Currently in Pain? No/denies   Pain Onset 1 to 4 weeks ago        There ex: Recumbent bike x4 min: no charge and was able to perform full revolutions Calf stretch 3x20    Terminal knee extension 2x10 green band  Eccentric step downs from 4 inch step, pt required UE assist for balance and decrease weight on R LE Side stepping with red band above knees x 4 laps in  //bars Hip extension with red band 2x10 each LE Squats on AIREX 2x10, pt required verbal cueing for decreasing knee valgus R leg press with 45# 2x10 Heel toe waling in //bars x2 laps   pt did well today with progression of LE strengthening.  pt is still lacking full right knee extension and requires mod verbal cues to shift weight to R throughout session versus leaning on L LE.       [facility] PT Assessment - 06/18/15 0001    Standardized Balance Assessment   Five times sit to stand comments  17 seconds    10 Meter Walk 0.44ms with small based quad cane    Timed Up and Go Test   Normal TUG (seconds) 17.06                                  PT Long Term Goals - 06/11/15 1650    PT LONG TERM GOAL #1   Title Patient will be independent with a home exercise program to increase her LE ROM, strength, and endurance to return to safe community mobility by 06/10/2015.   Status Achieved   PT LONG TERM GOAL #2   Title Patient will display at least 110 degrees of knee flexion ROM to increase her ability to negotiate stairs and community obstacles safely by  06/10/2015.    Baseline 95 degrees    Status Achieved   PT LONG TERM GOAL #3   Title Patient will indicate an increase in LEFS by at least 6 points to demonstrate increased functional mobility tolerance by 06/10/2015.    Baseline 49   Status Partially Met   PT LONG TERM GOAL #4   Title Patient will complete a 5x sit to stand in less than 15 seconds to demonstrate increased LE strength and power to increase safety with community mobility.    Baseline 26.65 seconds    Status Partially Met   PT LONG TERM GOAL #5   Title Patient will complete a TUG with minimally assistive device in less than 15 seconds to demonstrate increased LE strength and power to increase safety with community mobility.   Baseline 25.62 seconds    Status On-going   PT LONG TERM GOAL #6   Title Patient will complete a 52mwalk in less than 13 seconds to  demonstrate increased gait speed to return to community mobility.    Baseline 16.96 seconds    Status On-going   PT LONG TERM GOAL #7   Title Patient will increase her FGA by at least 3 points to demonstrate increased safety with mobility.    Baseline 15   Status Partially Met               Plan - 06/18/15 1636    Clinical Impression Statement Patient is using small based quad cane for all mobility with occasional use of SPC. Standard    Pt will benefit from skilled therapeutic intervention in order to improve on the following deficits Abnormal gait;Decreased balance;Decreased mobility;Decreased strength;Increased edema;Decreased activity tolerance;Decreased endurance;Pain;Decreased range of motion;Difficulty walking   Rehab Potential Fair   Clinical Impairments Affecting Rehab Potential Obvious memory deficits and pain with all mobility.    PT Frequency 2x / week   PT Duration 4 weeks   PT Treatment/Interventions Therapeutic exercise;Balance training;Moist Heat;Cryotherapy;Taping;Manual techniques;Stair training;Gait training;DME Instruction;ADLs/Self Care Home Management;Energy conservation;Neuromuscular re-education   PT Next Visit Plan manual, gait training, strengthening.    PT Home Exercise Plan continue as given    Consulted and Agree with Plan of Care Patient;Family member/caregiver   Family Member Consulted Spouse         Problem List There are no active problems to display for this patient.  JRenford Dills SPT Tortorici,Ashley 06/18/2015, 4:52 PM This entire session was performed under direct supervision and direction of a licensed therapist/therapist assistant . I have personally read, edited and approve of the note as written. AGorden Harms Torrtorici, PT, DPT #[id number]/8/16 0[place] [facility] [address] [address]Phone: [phone]  Fax:  [phone]

## 2015-06-15 ENCOUNTER — Ambulatory Visit: Payer: Medicare Other | Admitting: Physical Therapy

## 2015-06-15 ENCOUNTER — Encounter: Payer: Self-pay | Admitting: Physical Therapy

## 2015-06-15 DIAGNOSIS — Z96651 Presence of right artificial knee joint: Secondary | ICD-10-CM

## 2015-06-15 DIAGNOSIS — R262 Difficulty in walking, not elsewhere classified: Secondary | ICD-10-CM | POA: Diagnosis not present

## 2015-06-15 DIAGNOSIS — R531 Weakness: Secondary | ICD-10-CM

## 2015-06-15 NOTE — Therapy (Signed)
[place] MAIN [facility] SERVICES [address] [place], [place], [address] Phone: [phone]   Fax:  [phone]  Physical Therapy Treatment  Patient Details  Name: Crystal Burch MRN: [id number] Date of Birth: Oct 12, 1935 Referring Provider:  Merlene Morse, MD  Encounter Date: 06/15/2015      PT End of Session - 06/15/15 1135    Visit Number 14   Number of Visits 17   Date for PT Re-Evaluation 06/10/15   Authorization Type 14   Authorization Time Period 10   PT Start Time 1145   PT Stop Time 1225   PT Time Calculation (min) 40 min   Equipment Utilized During Treatment Gait belt   Activity Tolerance Patient tolerated treatment well   Behavior During Therapy [facility] for tasks assessed/performed      History reviewed. No pertinent past medical history.  History reviewed. No pertinent past surgical history.  There were no vitals filed for this visit.  Visit Diagnosis:  Difficulty walking  H/O total knee replacement, right  Generalized weakness    Therapeutic exercise:  Hamstring stretches x 30 sec x 3 Standing with RTB 3 way RLE Step ups  Lunges into BOSU ball x 20 Raising up her right knee in straight path Supine over bolster and quad sets with 3 sec hold Supine over bolster and 10 lb weight over knee x 1 minutes Supine hamstring stretch with overpressure  CGA and Min to mod verbal cues used throughout with increased in postural sway and LOB most seen with narrow base of support and while on uneven surfaces. Continues to have balance deficits typical with diagnosis. Patient performs intermediate level exercises without pain behaviors and needs verbal cuing for postural alignment and head positioning Tactile cues and assistance needed to keep lower leg and knee in neutral to avoid compensations with ankle motions.                           PT Education - 06/15/15 1134    Education provided Yes   Education Details hamstring stretching   Person(s) Educated Patient   Comprehension Verbalized understanding             PT Long Term Goals - 06/09/15 0759    PT LONG TERM GOAL #1   Title Patient will be independent with a home exercise program to increase her LE ROM, strength, and endurance to return to safe community mobility by 06/10/2015.   Status Achieved   PT LONG TERM GOAL #2   Title Patient will display at least 110 degrees of knee flexion ROM to increase her ability to negotiate stairs and community obstacles safely by 06/10/2015.    Baseline 95 degrees    Status Achieved   PT LONG TERM GOAL #3   Title Patient will indicate an increase in LEFS by at least 6 points to demonstrate increased functional mobility tolerance by 06/10/2015.    Baseline 49   Status Partially Met   PT LONG TERM GOAL #4   Title Patient will complete a 5x sit to stand in less than 15 seconds to demonstrate increased LE strength and power to increase safety with community mobility.    Baseline 26.65 seconds    Status Partially Met   PT LONG TERM GOAL #5   Title Patient will complete a TUG with minimally assistive device in less than 15 seconds to demonstrate increased LE strength and power to increase safety with community  mobility.   Baseline 25.62 seconds    Status Partially Met   PT LONG TERM GOAL #6   Title Patient will complete a 8mwalk in less than 13 seconds to demonstrate increased gait speed to return to community mobility.    Baseline 16.96 seconds    Status Partially Met   PT LONG TERM GOAL #7   Title Patient will increase her FGA by at least 3 points to demonstrate increased safety with mobility.    Baseline 15   Status Partially Met               Problem List There are no active problems to display for this patient.   MAlanson Puls10/06/2015, 11:38 AM  [place]MAIN RL[facility] LL[facility]ERVICES [address] StreetRBeverly [place]  [address]Phone: [phone]  Fax:  [phone]

## 2015-06-16 ENCOUNTER — Encounter: Payer: Medicare Other | Admitting: Physical Therapy

## 2015-06-18 ENCOUNTER — Ambulatory Visit: Payer: Medicare Other | Admitting: Physical Therapy

## 2015-06-18 ENCOUNTER — Encounter: Payer: Self-pay | Admitting: Physical Therapy

## 2015-06-18 DIAGNOSIS — R262 Difficulty in walking, not elsewhere classified: Secondary | ICD-10-CM | POA: Diagnosis not present

## 2015-06-18 DIAGNOSIS — R531 Weakness: Secondary | ICD-10-CM

## 2015-06-18 DIAGNOSIS — Z96651 Presence of right artificial knee joint: Secondary | ICD-10-CM

## 2015-06-18 NOTE — Addendum Note (Signed)
Addended by: Mariane Masters on: 06/18/2015 04:52 PM   Modules accepted: Orders

## 2015-06-19 NOTE — Therapy (Addendum)
[place] MAIN [facility] SERVICES [address] [place], [place], [address] Phone: [phone]   Fax:  [phone]  Physical Therapy Treatment  Patient Details  Name: Crystal Burch MRN: [id number] Date of Birth: Dec 29, 1935 No Data Recorded  Encounter Date: 06/18/2015      PT End of Session - 06/18/15 1617    Visit Number 15   Number of Visits 17   Date for PT Re-Evaluation 06/10/15   Authorization Type 5   Authorization Time Period 10   PT Start Time 1604   PT Stop Time 0277   PT Time Calculation (min) 41 min   Equipment Utilized During Treatment Gait belt   Activity Tolerance Patient tolerated treatment well   Behavior During Therapy [facility] for tasks assessed/performed      History reviewed. No pertinent past medical history.  History reviewed. No pertinent past surgical history.  There were no vitals filed for this visit.  Visit Diagnosis:  Difficulty walking  H/O total knee replacement, right  Generalized weakness      Subjective Assessment - 06/18/15 1615    Subjective Patient is now using small based quad cane. She states that she went to the hospital in [facility] due to chest pain. She was kept overnight for observation; the MD discharged her with esophageal spasm. but recommended that she follow up PCP.    Patient is accompained by: Family member   Limitations Walking;Standing   Patient Stated Goals "Get it where it won't be stiff and walk without the walker".    Currently in Pain? No/denies   Pain Onset 1 to 4 weeks ago          Treatment  Recumbent bike 4 minutes (unbilled)  Patient able to make full rotation with BLE  PT assessed R knee AROM.  Knee extension -3 degrees AROM in supine.  Knee Flexion 120 degrees   Standardized outcomes performed on this day including 5 x STS, TUG and 20mwalk test. See above or results   Therex  Lunge on BOSU 2x 10 on R LE, no UE support Step up to 5 inch sets x 10  BLE 1 HHA  Standing calf stretch 2 x 20 seconds R LE  Supine quad set 2x 12 R LE  Supine SLR 2x 10 R LE   Min Verbal instruction provided to improve weight bearing on the R LE with all therex as well as improve ROM and increase knee control. Patient responded well to instruction from PT.    PT performed Grade III PA and AP mobilizations to the R knee 3 bouts of 30 second in each direction PT performed Hamstring stretch 3 x 20 seconds on the R LE   Patient reported that her knee felt a little more mobile following manual therapy.                          PT Education - 06/19/15 0809    Education provided Yes   Education Details LE strengthening, Manual therapy    Person(s) Educated Patient   Methods Explanation;Demonstration;Verbal cues   Comprehension Verbalized understanding;Returned demonstration;Verbal cues required             PT Long Term Goals - 06/11/15 1650    PT LONG TERM GOAL #1   Title Patient will be independent with a home exercise program to increase her LE ROM, strength, and endurance to return to safe community mobility by 06/10/2015.  Status Achieved   PT LONG TERM GOAL #2   Title Patient will display at least 110 degrees of knee flexion ROM to increase her ability to negotiate stairs and community obstacles safely by 06/10/2015.    Baseline 95 degrees    Status Achieved   PT LONG TERM GOAL #3   Title Patient will indicate an increase in LEFS by at least 6 points to demonstrate increased functional mobility tolerance by 06/10/2015.    Baseline 49   Status Partially Met   PT LONG TERM GOAL #4   Title Patient will complete a 5x sit to stand in less than 15 seconds to demonstrate increased LE strength and power to increase safety with community mobility.    Baseline 26.65 seconds    Status Partially Met   PT LONG TERM GOAL #5   Title Patient will complete a TUG with minimally assistive device in less than 15 seconds to demonstrate increased  LE strength and power to increase safety with community mobility.   Baseline 25.62 seconds    Status On-going   PT LONG TERM GOAL #6   Title Patient will complete a 40mwalk in less than 13 seconds to demonstrate increased gait speed to return to community mobility.    Baseline 16.96 seconds    Status On-going   PT LONG TERM GOAL #7   Title Patient will increase her FGA by at least 3 points to demonstrate increased safety with mobility.    Baseline 15   Status Partially Met               Plan - 06/18/15 1636    Clinical Impression Statement Patient is using small based quad cane for all mobility with occasional use of SPC. Standardized outcome measures performed on this day including 5xSTS, 171malk test, and the Tug. Patient demonstrated improved function on all outcomes assessed, but still scores in the increased fall risk category. Patient also instructed in LE strengthening exercises with min verbal instruction provided by PT for increased ROM and weight bearing on the R LE. PT assessed ROM following manual therapy and noted a slight increase in R knee flexion and extension, but she still lacks 3 degrees full knee extension. Continued skilled PT is recommended to improve knee ROM improve Gait and improve strength to allow return to PLOF.   Pt will benefit from skilled therapeutic intervention in order to improve on the following deficits Abnormal gait;Decreased balance;Decreased mobility;Decreased strength;Increased edema;Decreased activity tolerance;Decreased endurance;Pain;Decreased range of motion;Difficulty walking   Rehab Potential Fair   Clinical Impairments Affecting Rehab Potential Obvious memory deficits and pain with all mobility.    PT Frequency 2x / week   PT Duration 4 weeks   PT Treatment/Interventions Therapeutic exercise;Balance training;Moist Heat;Cryotherapy;Taping;Manual techniques;Stair training;Gait training;DME Instruction;ADLs/Self Care Home Management;Energy  conservation;Neuromuscular re-education   PT Next Visit Plan manual, gait training, strengthening.    PT Home Exercise Plan continue as given    Consulted and Agree with Plan of Care Patient;Family member/caregiver   Family Member Consulted Spouse         Problem List There are no active problems to display for this patient.   AuBarrie FolkPT 06/19/2015   1:39 PM   PaRoyce Macadamia0/14/2016, 1:39 PM   This entire session was performed under direct supervision and direction of a licensed therapist/therapist assistant . I have personally read, edited and approve of the note as written. PaKerman PasseyPT, DPT    [place] MAIN [facility] SERVICES [place], [place], [address] Phone: [phone]   Fax:  [phone]  Name: Crystal Burch MRN: [id number] Date of Birth: [birth year]

## 2015-06-22 ENCOUNTER — Ambulatory Visit: Payer: Medicare Other | Admitting: Physical Therapy

## 2015-06-22 ENCOUNTER — Encounter: Payer: Self-pay | Admitting: Physical Therapy

## 2015-06-22 DIAGNOSIS — R531 Weakness: Secondary | ICD-10-CM

## 2015-06-22 DIAGNOSIS — R262 Difficulty in walking, not elsewhere classified: Secondary | ICD-10-CM | POA: Diagnosis not present

## 2015-06-22 DIAGNOSIS — Z96651 Presence of right artificial knee joint: Secondary | ICD-10-CM

## 2015-06-22 NOTE — Therapy (Signed)
[place] MAIN [facility] SERVICES [address] [place], [place], [address] Phone: [phone]   Fax:  [phone]  Physical Therapy Treatment  Patient Details  Name: Crystal Burch MRN: [id number] Date of Birth: 08-09-1936 No Data Recorded  Encounter Date: 06/22/2015      PT End of Session - 06/22/15 1428    Visit Number 16   Number of Visits 17   Date for PT Re-Evaluation 06/10/15   Authorization Type 16   PT Start Time 0200   PT Stop Time 0245   PT Time Calculation (min) 45 min   Equipment Utilized During Treatment Gait belt   Activity Tolerance Patient tolerated treatment well      History reviewed. No pertinent past medical history.  History reviewed. No pertinent past surgical history.  There were no vitals filed for this visit.  Visit Diagnosis:  Difficulty walking  H/O total knee replacement, right  Generalized weakness      Subjective Assessment - 06/22/15 1424    Subjective Patient is having a minimal amount of pain in her right knee today.    Patient is accompained by: Family member   Limitations Walking;Standing   Patient Stated Goals "Get it where it won't be stiff and walk without the walker".    Pain Score 0-No pain   Pain Onset 1 to 4 weeks ago      Step ups x 20  Hip 4 way with RTB Hip abd with RTB  X 10 x 2 Stretching right knee extension x 30 sec x 3, knee flex stretch x 30 x 3  Prone lying and stretching hamstirng Knee extension exercises to promote full extension in Right kinee . Standing knee extension with theraball x 20  Gait training with emphasis on terminal knee extension during stance phase of gait. Patient needs occasional verbal cueing to improve posture and cueing to correctly perform exercises slowly, holding at end of range to increase motor firing of desired muscle to encourage fatigue                          PT Education - 06/22/15 1425    Education provided Yes    Education Details LE strengthening    Person(s) Educated Patient   Methods Explanation   Comprehension Verbalized understanding             PT Long Term Goals - 06/11/15 1650    PT LONG TERM GOAL #1   Title Patient will be independent with a home exercise program to increase her LE ROM, strength, and endurance to return to safe community mobility by 06/10/2015.   Status Achieved   PT LONG TERM GOAL #2   Title Patient will display at least 110 degrees of knee flexion ROM to increase her ability to negotiate stairs and community obstacles safely by 06/10/2015.    Baseline 95 degrees    Status Achieved   PT LONG TERM GOAL #3   Title Patient will indicate an increase in LEFS by at least 6 points to demonstrate increased functional mobility tolerance by 06/10/2015.    Baseline 49   Status Partially Met   PT LONG TERM GOAL #4   Title Patient will complete a 5x sit to stand in less than 15 seconds to demonstrate increased LE strength and power to increase safety with community mobility.    Baseline 26.65 seconds    Status Partially Met   PT LONG TERM  GOAL #5   Title Patient will complete a TUG with minimally assistive device in less than 15 seconds to demonstrate increased LE strength and power to increase safety with community mobility.   Baseline 25.62 seconds    Status On-going   PT LONG TERM GOAL #6   Title Patient will complete a 106mwalk in less than 13 seconds to demonstrate increased gait speed to return to community mobility.    Baseline 16.96 seconds    Status On-going   PT LONG TERM GOAL #7   Title Patient will increase her FGA by at least 3 points to demonstrate increased safety with mobility.    Baseline 15   Status Partially Met               Plan - 06/22/15 1438    Clinical Impression Statement Patient is able to perform all exericses and was instructed in HEP. She has improved in her AROM to right knee motion.    Pt will benefit from skilled therapeutic  intervention in order to improve on the following deficits Abnormal gait;Decreased balance;Decreased mobility;Decreased strength;Increased edema;Decreased activity tolerance;Decreased endurance;Pain;Decreased range of motion;Difficulty walking   Rehab Potential Fair   Clinical Impairments Affecting Rehab Potential Obvious memory deficits and pain with all mobility.    PT Frequency 2x / week   PT Duration 4 weeks   PT Treatment/Interventions Therapeutic exercise;Balance training;Moist Heat;Cryotherapy;Taping;Manual techniques;Stair training;Gait training;DME Instruction;ADLs/Self Care Home Management;Energy conservation;Neuromuscular re-education   PT Next Visit Plan manual, gait training, strengthening.    PT Home Exercise Plan continue as given    Consulted and Agree with Plan of Care Patient;Family member/caregiver   Family Member Consulted Spouse         Problem List There are no active problems to display for this patient.   MAlanson Puls10/17/2016, 3:11 PM  CRuleMAIN [facility]ied[facility] CenterSERV[facility] [address] [address]Phone: [phone]  Fax:  [phone] Name: Crystal ROZYCKIMRN: [id number] of Birth: [birth year]

## 2015-06-24 ENCOUNTER — Encounter: Payer: Self-pay | Admitting: Physical Therapy

## 2015-06-24 ENCOUNTER — Ambulatory Visit: Payer: Medicare Other | Admitting: Physical Therapy

## 2015-06-24 DIAGNOSIS — R262 Difficulty in walking, not elsewhere classified: Secondary | ICD-10-CM

## 2015-06-24 DIAGNOSIS — Z96651 Presence of right artificial knee joint: Secondary | ICD-10-CM

## 2015-06-24 DIAGNOSIS — R531 Weakness: Secondary | ICD-10-CM

## 2015-06-24 NOTE — Therapy (Signed)
[place] MAIN [facility] SERVICES [address] [place], [place], [address] Phone: [phone]   Fax:  [phone]  Physical Therapy Treatment/ DC summary  Patient Details  Name: Crystal Burch MRN: [id number] Date of Birth: 04-13-1936 No Data Recorded  Encounter Date: 06/24/2015      PT End of Session - 06/24/15 1359    Visit Number 16   Number of Visits 17   Date for PT Re-Evaluation 06/10/15   Authorization Type 16   Equipment Utilized During Treatment Gait belt   Activity Tolerance Patient tolerated treatment well      History reviewed. No pertinent past medical history.  History reviewed. No pertinent past surgical history.  There were no vitals filed for this visit.  Visit Diagnosis:  Difficulty walking  H/O total knee replacement, right  Generalized weakness      Subjective Assessment - 06/24/15 1358    Subjective Patient is having a minimal amount of pain in her right knee today.    Patient is accompained by: Family member   Limitations Walking;Standing   Patient Stated Goals "Get it where it won't be stiff and walk without the walker".    Currently in Pain? No/denies   Pain Onset 1 to 4 weeks ago      PT instructed Patient in standardized outcome measures to assess progress including : 5x STS, 10 m walk test, 6 minute walk testLEFS  See below for results  17.40 TUG 5 x sit to stand 15.51 10 MW=. 35m sec 6 MW 200 feet LEFS 52 Leg press 75 lbs x 20 x 3 sets, heel raises x 20 x 3 sets.  Gait training in steps  With instruction for correct sequencing                                                           PT Education - 06/24/15 1358    Education provided Yes   Education Details HEP   Person(s) Educated Patient   Methods Explanation   Comprehension Verbalized understanding             PT Long Term Goals - 06/11/15 1650    PT LONG TERM GOAL #1   Title Patient will be  independent with a home exercise program to increase her LE ROM, strength, and endurance to return to safe community mobility by 06/10/2015.   Status Achieved   PT LONG TERM GOAL #2   Title Patient will display at least 110 degrees of knee flexion ROM to increase her ability to negotiate stairs and community obstacles safely by 06/10/2015.    Baseline 95 degrees    Status Achieved   PT LONG TERM GOAL #3   Title Patient will indicate an increase in LEFS by at least 6 points to demonstrate increased functional mobility tolerance by 06/10/2015.    Baseline 49   Status Partially Met   PT LONG TERM GOAL #4   Title Patient will complete a 5x sit to stand in less than 15 seconds to demonstrate increased LE strength and power to increase safety with community mobility.    Baseline 26.65 seconds    Status Partially Met   PT LONG TERM GOAL #5   Title Patient will complete a TUG with minimally assistive device in less than  15 seconds to demonstrate increased LE strength and power to increase safety with community mobility.   Baseline 25.62 seconds    Status On-going   PT LONG TERM GOAL #6   Title Patient will complete a 53mwalk in less than 13 seconds to demonstrate increased gait speed to return to community mobility.    Baseline 16.96 seconds    Status On-going   PT LONG TERM GOAL #7   Title Patient will increase her FGA by at least 3 points to demonstrate increased safety with mobility.    Baseline 15   Status Partially Met               Plan - 06/24/15 1402    Clinical Impression Statement PT provided min - moderate verbal instruction to improve set up, proper use of LE, and improved posture and gait mechanics. Patient responded moderately to instruction. Patient is being [facility] from [facility].    Pt will benefit from skilled therapeutic intervention in order to improve on the following deficits Abnormal gait;Decreased balance;Decreased mobility;Decreased strength;Increased  edema;Decreased activity tolerance;Decreased endurance;Pain;Decreased range of motion;Difficulty walking   Rehab Potential Fair   Clinical Impairments Affecting Rehab Potential Obvious memory deficits and pain with all mobility.    PT Frequency 2x / week   PT Duration 4 weeks   PT Treatment/Interventions Therapeutic exercise;Balance training;Moist Heat;Cryotherapy;Taping;Manual techniques;Stair training;Gait training;DME Instruction;ADLs/Self Care Home Management;Energy conservation;Neuromuscular re-education   PT Next Visit Plan manual, gait training, strengthening.    PT Home Exercise Plan continue as given    Consulted and Agree with Plan of Care Patient;Family member/caregiver   Family Member Consulted Spouse         Problem List There are no active problems to display for this patient.   MAlanson Puls10/19/2016, 2:10 PM  CParnellMAIN RPec[facility] HospitalSERVICES [address] [address]Phone: [phone]  Fax:  [phone] Name: Crystal ORNEMRN: [id number] of Birth: [birth year]

## 2016-04-12 ENCOUNTER — Other Ambulatory Visit: Payer: Self-pay | Admitting: Family Medicine

## 2016-04-12 DIAGNOSIS — D329 Benign neoplasm of meninges, unspecified: Secondary | ICD-10-CM

## 2016-04-25 ENCOUNTER — Ambulatory Visit: Payer: PRIVATE HEALTH INSURANCE

## 2019-03-31 ENCOUNTER — Emergency Department: Payer: Medicare (Managed Care)

## 2019-03-31 ENCOUNTER — Encounter: Payer: Self-pay | Admitting: Emergency Medicine

## 2019-03-31 ENCOUNTER — Emergency Department
Admission: EM | Admit: 2019-03-31 | Discharge: 2019-03-31 | Disposition: A | Discharge status: Home / Self Care | Payer: Medicare (Managed Care) | Attending: Emergency Medicine | Admitting: Emergency Medicine

## 2019-03-31 DIAGNOSIS — R410 Disorientation, unspecified: Secondary | ICD-10-CM | POA: Insufficient documentation

## 2019-03-31 DIAGNOSIS — F0390 Unspecified dementia without behavioral disturbance: Secondary | ICD-10-CM | POA: Insufficient documentation

## 2019-03-31 HISTORY — DX: Unspecified dementia, unspecified severity, without behavioral disturbance, psychotic disturbance, mood disturbance, and anxiety: F03.90

## 2019-03-31 HISTORY — DX: Essential (primary) hypertension: I10

## 2019-03-31 LAB — COMPREHENSIVE METABOLIC PANEL
ALT: 22 U/L (ref 0–44)
AST: 28 U/L (ref 15–41)
Albumin: 3.9 g/dL (ref 3.5–5.0)
Alkaline Phosphatase: 61 U/L (ref 38–126)
Anion gap: 7 (ref 5–15)
BUN: 22 mg/dL (ref 8–23)
CO2: 26 mmol/L (ref 22–32)
Calcium: 9.3 mg/dL (ref 8.9–10.3)
Chloride: 107 mmol/L (ref 98–111)
Creatinine, Ser: 0.65 mg/dL (ref 0.44–1.00)
GFR calc Af Amer: >60 mL/min (ref >60)
GFR calc non Af Amer: >60 mL/min (ref >60)
Glucose, Bld: 118 mg/dL — ABNORMAL HIGH (ref 70–99)
Potassium: 3.7 mmol/L (ref 3.5–5.1)
Sodium: 140 mmol/L (ref 135–145)
Total Bilirubin: 0.8 mg/dL (ref 0.3–1.2)
Total Protein: 6.6 g/dL (ref 6.5–8.1)

## 2019-03-31 LAB — URINALYSIS, COMPLETE (UACMP) WITH MICROSCOPIC
Bilirubin Urine: NEGATIVE
Glucose, UA: NEGATIVE mg/dL
Hgb urine dipstick: NEGATIVE
Ketones, ur: NEGATIVE mg/dL
Leukocytes,Ua: NEGATIVE
Nitrite: NEGATIVE
Protein, ur: NEGATIVE mg/dL
Specific Gravity, Urine: 1.015 (ref 1.005–1.030)
pH: 7 (ref 5.0–8.0)

## 2019-03-31 LAB — CBC WITH DIFFERENTIAL/PLATELET
Abs Immature Granulocytes: 0.01 10*3/uL (ref 0.00–0.07)
Basophils Absolute: 0 10*3/uL (ref 0.0–0.1)
Basophils Relative: 1 %
Eosinophils Absolute: 0.1 10*3/uL (ref 0.0–0.5)
Eosinophils Relative: 1 %
HCT: 40.3 % (ref 36.0–46.0)
Hemoglobin: 12.8 g/dL (ref 12.0–15.0)
Immature Granulocytes: 0 %
Lymphocytes Relative: 44 %
Lymphs Abs: 2.5 10*3/uL (ref 0.7–4.0)
MCH: 29.4 pg (ref 26.0–34.0)
MCHC: 31.8 g/dL (ref 30.0–36.0)
MCV: 92.6 fL (ref 80.0–100.0)
Monocytes Absolute: 0.6 10*3/uL (ref 0.1–1.0)
Monocytes Relative: 10 %
Neutro Abs: 2.5 10*3/uL (ref 1.7–7.7)
Neutrophils Relative %: 44 %
Platelets: 133 10*3/uL — ABNORMAL LOW (ref 150–400)
RBC: 4.35 MIL/uL (ref 3.87–5.11)
RDW: 14.4 % (ref 11.5–15.5)
WBC: 5.6 10*3/uL (ref 4.0–10.5)
nRBC: 0 % (ref 0.0–0.2)

## 2019-03-31 LAB — GLUCOSE, CAPILLARY: Glucose-Capillary: 105 mg/dL — ABNORMAL HIGH (ref 70–99)

## 2019-03-31 NOTE — ED Notes (Signed)
Reviewed discharge instructions, follow-up care with patient and patient's son. Patient and son verbalized understanding of all information reviewed. Patient stable, with no distress noted at this time.

## 2019-03-31 NOTE — ED Triage Notes (Signed)
Patient coming [facility] from home for intermittent AMS. Patient had incomprehensible speech. Normal speech and oriented at baseline at EMS arrival. Patient since then will intermittently be oriented to time/disoriented to time.

## 2019-03-31 NOTE — ED Provider Notes (Signed)
[facility] Emergency Department Provider Note       Time seen: ----------------------------------------- 9:30 PM on 03/31/2019 -----------------------------------------  Level V caveat: History/ROS limited by dementia I have reviewed the triage vital signs and the nursing notes.  HISTORY   Chief Complaint Altered Mental Status    HPI Crystal Burch is a 83 y.o. female with a history of dementia who presents to the ED for increased confusion.  Patient arrives from home, family felt she was more confused than normal.  She denies any complaints.  No past medical history on file.  There are no active problems to display for this patient.   No past surgical history on file.  Allergies Patient has no allergy information on record.  Social History Social History   Tobacco Use  . Smoking status: Never Smoker  Substance Use Topics  . Alcohol use: Not on file  . Drug use: Not on file    Review of Systems Unknown, patient denies complaints, history of dementia  All systems negative/normal/unremarkable except as stated in the HPI  ____________________________________________   PHYSICAL EXAM:  VITAL SIGNS: ED Triage Vitals  Enc Vitals Group     BP      Pulse      Resp      Temp      Temp src      SpO2      Weight      Height      Head Circumference      Peak Flow      Pain Score      Pain Loc      Pain Edu?      Excl. in [place]?    Constitutional: Alert but disoriented.  Well appearing and in no distress. Eyes: Conjunctivae are normal. Normal extraocular movements. Cardiovascular: Normal rate, regular rhythm. No murmurs, rubs, or gallops. Respiratory: Normal respiratory effort without tachypnea nor retractions. Breath sounds are clear and equal bilaterally. No wheezes/rales/rhonchi. Gastrointestinal: Soft and nontender. Normal bowel sounds Musculoskeletal: Nontender with normal range of motion in extremities. No lower extremity  tenderness nor edema. Neurologic:  Normal speech and language. No gross focal neurologic deficits are appreciated.  Skin:  Skin is warm, dry and intact. No rash noted. Psychiatric: Mood and affect are normal. Speech and behavior are normal.  ____________________________________________  EKG: Interpreted by me.  Sinus rhythm with rate of 70 bpm, old inferior infarct, normal QT  ____________________________________________  ED COURSE:  As part of my medical decision making, I reviewed the following data within the [place] History obtained from family if available, nursing notes, old chart and ekg, as well as notes from prior ED visits. Patient presented for increased confusion, we will assess with labs and imaging as indicated at this time.   Procedures  Crystal Burch was evaluated in Emergency Department on 03/31/2019 for the symptoms described in the history of present illness. She was evaluated in the context of the global COVID-19 pandemic, which necessitated consideration that the patient might be at risk for infection with the SARS-CoV-2 virus that causes COVID-19. Institutional protocols and algorithms that pertain to the evaluation of patients at risk for COVID-19 are in a state of rapid change based on information released by regulatory bodies including the CDC and federal and state organizations. These policies and algorithms were followed during the patient's care in the ED.  ____________________________________________   LABS (pertinent positives/negatives)  Labs Reviewed  CBC WITH DIFFERENTIAL/PLATELET - Abnormal; Notable  for the following components:      Result Value   Platelets 133 (*)    All other components within normal limits  COMPREHENSIVE METABOLIC PANEL - Abnormal; Notable for the following components:   Glucose, Bld 118 (*)    All other components within normal limits  URINALYSIS, COMPLETE (UACMP) WITH MICROSCOPIC - Abnormal; Notable for the  following components:   Color, Urine YELLOW (*)    APPearance HAZY (*)    Bacteria, UA RARE (*)    All other components within normal limits  GLUCOSE, CAPILLARY - Abnormal; Notable for the following components:   Glucose-Capillary 105 (*)    All other components within normal limits  CBG MONITORING, ED    RADIOLOGY  CT head Is unremarkable ____________________________________________   DIFFERENTIAL DIAGNOSIS   Dementia, medication noncompliance, dehydration, occult infection  FINAL ASSESSMENT AND PLAN  Dementia   Plan: The patient had presented for increased confusion. Patient's labs were reassuring. Patient's imaging not reveal any acute process.  No clear etiology for any worsening confusion that she may have.  She is demented and does have short-term memory loss consistent with dementia.  We have not found any other acute emergency medical condition at this time.   Laurence Aly, MD    Note: This note was generated in part or whole with voice recognition software. Voice recognition is usually quite accurate but there are transcription errors that can and very often do occur. I apologize for any typographical errors that were not detected and corrected.     Earleen Newport, MD 03/31/19 2239

## 2019-03-31 NOTE — ED Notes (Signed)
Patient transported to CT 

## 2019-04-13 ENCOUNTER — Encounter: Payer: Self-pay | Admitting: Emergency Medicine

## 2019-04-13 ENCOUNTER — Emergency Department: Payer: Medicare (Managed Care)

## 2019-04-13 ENCOUNTER — Observation Stay
Admission: EM | Admit: 2019-04-13 | Discharge: 2019-04-14 | Disposition: A | Discharge status: Home / Self Care | Payer: Medicare (Managed Care) | Attending: Specialist | Admitting: Specialist

## 2019-04-13 ENCOUNTER — Other Ambulatory Visit: Payer: Self-pay

## 2019-04-13 DIAGNOSIS — R55 Syncope and collapse: Secondary | ICD-10-CM | POA: Diagnosis present

## 2019-04-13 DIAGNOSIS — Z7982 Long term (current) use of aspirin: Secondary | ICD-10-CM | POA: Insufficient documentation

## 2019-04-13 DIAGNOSIS — R4020 Unspecified coma: Secondary | ICD-10-CM | POA: Diagnosis present

## 2019-04-13 DIAGNOSIS — E785 Hyperlipidemia, unspecified: Secondary | ICD-10-CM | POA: Insufficient documentation

## 2019-04-13 DIAGNOSIS — Z79899 Other long term (current) drug therapy: Secondary | ICD-10-CM | POA: Diagnosis not present

## 2019-04-13 DIAGNOSIS — I10 Essential (primary) hypertension: Secondary | ICD-10-CM | POA: Insufficient documentation

## 2019-04-13 DIAGNOSIS — I313 Pericardial effusion (noninflammatory): Secondary | ICD-10-CM | POA: Diagnosis not present

## 2019-04-13 DIAGNOSIS — R32 Unspecified urinary incontinence: Secondary | ICD-10-CM | POA: Diagnosis not present

## 2019-04-13 DIAGNOSIS — R001 Bradycardia, unspecified: Secondary | ICD-10-CM | POA: Insufficient documentation

## 2019-04-13 DIAGNOSIS — Z791 Long term (current) use of non-steroidal anti-inflammatories (NSAID): Secondary | ICD-10-CM | POA: Insufficient documentation

## 2019-04-13 DIAGNOSIS — Z1159 Encounter for screening for other viral diseases: Secondary | ICD-10-CM | POA: Diagnosis not present

## 2019-04-13 DIAGNOSIS — G629 Polyneuropathy, unspecified: Secondary | ICD-10-CM | POA: Diagnosis not present

## 2019-04-13 DIAGNOSIS — K219 Gastro-esophageal reflux disease without esophagitis: Secondary | ICD-10-CM | POA: Diagnosis not present

## 2019-04-13 DIAGNOSIS — F0390 Unspecified dementia without behavioral disturbance: Secondary | ICD-10-CM | POA: Diagnosis not present

## 2019-04-13 LAB — CBC WITH DIFFERENTIAL/PLATELET
Abs Immature Granulocytes: 0.02 10*3/uL (ref 0.00–0.07)
Basophils Absolute: 0 10*3/uL (ref 0.0–0.1)
Basophils Relative: 0 %
Eosinophils Absolute: 0.1 10*3/uL (ref 0.0–0.5)
Eosinophils Relative: 1 %
HCT: 39.8 % (ref 36.0–46.0)
Hemoglobin: 12.6 g/dL (ref 12.0–15.0)
Immature Granulocytes: 0 %
Lymphocytes Relative: 29 %
Lymphs Abs: 1.7 10*3/uL (ref 0.7–4.0)
MCH: 29.3 pg (ref 26.0–34.0)
MCHC: 31.7 g/dL (ref 30.0–36.0)
MCV: 92.6 fL (ref 80.0–100.0)
Monocytes Absolute: 0.4 10*3/uL (ref 0.1–1.0)
Monocytes Relative: 7 %
Neutro Abs: 3.7 10*3/uL (ref 1.7–7.7)
Neutrophils Relative %: 63 %
Platelets: 139 10*3/uL — ABNORMAL LOW (ref 150–400)
RBC: 4.3 MIL/uL (ref 3.87–5.11)
RDW: 15 % (ref 11.5–15.5)
WBC: 5.9 10*3/uL (ref 4.0–10.5)
nRBC: 0 % (ref 0.0–0.2)

## 2019-04-13 LAB — COMPREHENSIVE METABOLIC PANEL
ALT: 19 U/L (ref 0–44)
AST: 25 U/L (ref 15–41)
Albumin: 3.6 g/dL (ref 3.5–5.0)
Alkaline Phosphatase: 53 U/L (ref 38–126)
Anion gap: 8 (ref 5–15)
BUN: 21 mg/dL (ref 8–23)
CO2: 26 mmol/L (ref 22–32)
Calcium: 9.1 mg/dL (ref 8.9–10.3)
Chloride: 106 mmol/L (ref 98–111)
Creatinine, Ser: 0.64 mg/dL (ref 0.44–1.00)
GFR calc Af Amer: >60 mL/min (ref >60)
GFR calc non Af Amer: >60 mL/min (ref >60)
Glucose, Bld: 112 mg/dL — ABNORMAL HIGH (ref 70–99)
Potassium: 4.4 mmol/L (ref 3.5–5.1)
Sodium: 140 mmol/L (ref 135–145)
Total Bilirubin: 0.6 mg/dL (ref 0.3–1.2)
Total Protein: 6.4 g/dL — ABNORMAL LOW (ref 6.5–8.1)

## 2019-04-13 LAB — URINALYSIS, COMPLETE (UACMP) WITH MICROSCOPIC
Bilirubin Urine: NEGATIVE
Glucose, UA: NEGATIVE mg/dL
Hgb urine dipstick: NEGATIVE
Ketones, ur: NEGATIVE mg/dL
Leukocytes,Ua: NEGATIVE
Nitrite: NEGATIVE
Protein, ur: 30 mg/dL — AB
Specific Gravity, Urine: 1.021 (ref 1.005–1.030)
pH: 5 (ref 5.0–8.0)

## 2019-04-13 LAB — TROPONIN I (HIGH SENSITIVITY)
Troponin I (High Sensitivity): 8 ng/L (ref <18)
Troponin I (High Sensitivity): 5 ng/L (ref <18)

## 2019-04-13 MED ORDER — DONEPEZIL HCL 5 MG PO TABS
10.0000 mg | ORAL_TABLET | Freq: Every day | ORAL | Status: DC
  Filled 2019-04-13: qty 2

## 2019-04-13 MED ORDER — PANTOPRAZOLE SODIUM 40 MG PO TBEC
40.0000 mg | DELAYED_RELEASE_TABLET | Freq: Every day | ORAL | Status: DC
  Administered 2019-04-13 – 2019-04-14 (×2): 40 mg via ORAL
  Filled 2019-04-13 (×2): qty 1

## 2019-04-13 MED ORDER — VITAMIN B-12 1000 MCG PO TABS
1000.0000 ug | ORAL_TABLET | Freq: Every day | ORAL | Status: DC
  Administered 2019-04-13 – 2019-04-14 (×2): 1000 ug via ORAL
  Filled 2019-04-13 (×2): qty 1

## 2019-04-13 MED ORDER — ASPIRIN EC 81 MG PO TBEC
81.0000 mg | DELAYED_RELEASE_TABLET | Freq: Every day | ORAL | Status: DC
  Administered 2019-04-13 – 2019-04-14 (×2): 81 mg via ORAL
  Filled 2019-04-13 (×2): qty 1

## 2019-04-13 MED ORDER — ONDANSETRON HCL 4 MG PO TABS: 4.0000 mg | ORAL_TABLET | Freq: Four times a day (QID) | ORAL | Status: DC | PRN

## 2019-04-13 MED ORDER — ACETAMINOPHEN 650 MG RE SUPP: 650.0000 mg | Freq: Four times a day (QID) | RECTAL | Status: DC | PRN

## 2019-04-13 MED ORDER — OXYBUTYNIN CHLORIDE 5 MG PO TABS
5.0000 mg | ORAL_TABLET | Freq: Two times a day (BID) | ORAL | Status: DC
  Administered 2019-04-13 – 2019-04-14 (×2): 5 mg via ORAL
  Filled 2019-04-13 (×3): qty 1

## 2019-04-13 MED ORDER — ACETAMINOPHEN 325 MG PO TABS: 650.0000 mg | ORAL_TABLET | Freq: Four times a day (QID) | ORAL | Status: DC | PRN

## 2019-04-13 MED ORDER — LISINOPRIL 5 MG PO TABS
7.5000 mg | ORAL_TABLET | Freq: Every day | ORAL | Status: DC
  Administered 2019-04-13: 7.5 mg via ORAL
  Filled 2019-04-13: qty 2

## 2019-04-13 MED ORDER — CITALOPRAM HYDROBROMIDE 20 MG PO TABS
10.0000 mg | ORAL_TABLET | Freq: Every day | ORAL | Status: DC
  Administered 2019-04-13 – 2019-04-14 (×2): 10 mg via ORAL
  Filled 2019-04-13 (×2): qty 1

## 2019-04-13 MED ORDER — ONDANSETRON HCL 4 MG/2ML IJ SOLN: 4.0000 mg | Freq: Four times a day (QID) | INTRAMUSCULAR | Status: DC | PRN

## 2019-04-13 MED ORDER — ENOXAPARIN SODIUM 40 MG/0.4ML ~~LOC~~ SOLN
40.0000 mg | SUBCUTANEOUS | Status: DC
  Administered 2019-04-13: 40 mg via SUBCUTANEOUS
  Filled 2019-04-13: qty 0.4

## 2019-04-13 MED ORDER — GABAPENTIN 300 MG PO CAPS
600.0000 mg | ORAL_CAPSULE | Freq: Every day | ORAL | Status: DC
  Administered 2019-04-13 – 2019-04-14 (×2): 600 mg via ORAL
  Filled 2019-04-13 (×2): qty 2

## 2019-04-13 MED ORDER — ATORVASTATIN CALCIUM 20 MG PO TABS
40.0000 mg | ORAL_TABLET | Freq: Every day | ORAL | Status: DC
  Administered 2019-04-13 – 2019-04-14 (×2): 40 mg via ORAL
  Filled 2019-04-13 (×2): qty 2

## 2019-04-13 MED ORDER — DONEPEZIL HCL 5 MG PO TABS
10.0000 mg | ORAL_TABLET | Freq: Every evening | ORAL | Status: DC
  Administered 2019-04-13: 10 mg via ORAL
  Filled 2019-04-13: qty 2

## 2019-04-13 MED ORDER — MELOXICAM 7.5 MG PO TABS
7.5000 mg | ORAL_TABLET | Freq: Every day | ORAL | Status: DC
  Administered 2019-04-13 – 2019-04-14 (×2): 7.5 mg via ORAL
  Filled 2019-04-13 (×2): qty 1

## 2019-04-13 NOTE — ED Notes (Signed)
ED TO INPATIENT HANDOFF REPORT  ED Nurse Name and Phone #: Yetta Flock, Bertha Name/Age/Gender Crystal Burch 83 y.o. female Room/Bed: ED24A/ED24A  Code Status   Code Status: Not on file  Home/SNF/Other Home Patient oriented to: self, place, time and situation Is this baseline? Yes   Triage Complete: Triage complete  Chief Complaint AMS  Triage Note Pt husband called EMS due to pt not waking up from chair. Pt complains of lower back pain.    Allergies No Known Allergies  Level of Care/Admitting Diagnosis ED Disposition    ED Disposition Condition [facility] Area: [place] [[id number]]  Level of Care: Telemetry [5]  Covid Evaluation: Person Under Investigation (PUI)  Diagnosis: Syncope [562130]  Admitting Physician: Henreitta Leber [[id number]]  Attending Physician: Henreitta Leber [[id number]]  PT Class (Do Not Modify): Observation [104]  PT Acc Code (Do Not Modify): Observation [10022]       B Medical/Surgery History Past Medical History:  Diagnosis Date  . Dementia ([place])   . Hypertension    Past Surgical History:  Procedure Laterality Date  . ABDOMINAL HYSTERECTOMY       A IV Location/Drains/Wounds Patient Lines/Drains/Airways Status   Active Line/Drains/Airways    Name:   Placement date:   Placement time:   Site:   Days:   Peripheral IV 04/13/19 Left Antecubital   04/13/19    1018    Antecubital   less than 1          Intake/Output Last 24 hours No intake or output data in the 24 hours ending 04/13/19 1712  Labs/Imaging Results for orders placed or performed during the hospital encounter of 04/13/19 (from the past 48 hour(s))  Urinalysis, Complete w Microscopic     Status: Abnormal   Collection Time: 04/13/19 10:26 AM  Result Value Ref Range   Color, Urine YELLOW (A) YELLOW   APPearance HAZY (A) CLEAR   Specific Gravity, Urine 1.021 1.005 - 1.030   pH 5.0 5.0 - 8.0   Glucose, UA NEGATIVE NEGATIVE mg/dL   Hgb  urine dipstick NEGATIVE NEGATIVE   Bilirubin Urine NEGATIVE NEGATIVE   Ketones, ur NEGATIVE NEGATIVE mg/dL   Protein, ur 30 (A) NEGATIVE mg/dL   Nitrite NEGATIVE NEGATIVE   Leukocytes,Ua NEGATIVE NEGATIVE   RBC / HPF 6-10 0 - 5 RBC/hpf   WBC, UA 6-10 0 - 5 WBC/hpf   Bacteria, UA FEW (A) NONE SEEN   Squamous Epithelial / LPF 6-10 0 - 5   Mucus PRESENT    Hyaline Casts, UA PRESENT    Ca Oxalate Crys, UA PRESENT     Comment: Performed at [facility], [address] [place], [place] [address]  CBC with Differential/Platelet     Status: Abnormal   Collection Time: 04/13/19 10:55 AM  Result Value Ref Range   WBC 5.9 4.0 - 10.5 K/uL   RBC 4.30 3.87 - 5.11 MIL/uL   Hemoglobin 12.6 12.0 - 15.0 g/dL   HCT 39.8 36.0 - 46.0 %   MCV 92.6 80.0 - 100.0 fL   MCH 29.3 26.0 - 34.0 pg   MCHC 31.7 30.0 - 36.0 g/dL   RDW 15.0 11.5 - 15.5 %   Platelets 139 (L) 150 - 400 K/uL   nRBC 0.0 0.0 - 0.2 %   Neutrophils Relative % 63 %   Neutro Abs 3.7 1.7 - 7.7 K/uL   Lymphocytes Relative 29 %   Lymphs Abs 1.7  0.7 - 4.0 K/uL   Monocytes Relative 7 %   Monocytes Absolute 0.4 0.1 - 1.0 K/uL   Eosinophils Relative 1 %   Eosinophils Absolute 0.1 0.0 - 0.5 K/uL   Basophils Relative 0 %   Basophils Absolute 0.0 0.0 - 0.1 K/uL   Immature Granulocytes 0 %   Abs Immature Granulocytes 0.02 0.00 - 0.07 K/uL    Comment: Performed at [facility], [address] [place], [place] [address]  Comprehensive metabolic panel     Status: Abnormal   Collection Time: 04/13/19 10:55 AM  Result Value Ref Range   Sodium 140 135 - 145 mmol/L   Potassium 4.4 3.5 - 5.1 mmol/L   Chloride 106 98 - 111 mmol/L   CO2 26 22 - 32 mmol/L   Glucose, Bld 112 (H) 70 - 99 mg/dL   BUN 21 8 - 23 mg/dL   Creatinine, Ser 0.64 0.44 - 1.00 mg/dL   Calcium 9.1 8.9 - 10.3 mg/dL   Total Protein 6.4 (L) 6.5 - 8.1 g/dL   Albumin 3.6 3.5 - 5.0 g/dL   AST 25 15 - 41 U/L   ALT 19 0 - 44 U/L   Alkaline Phosphatase 53 38 -  126 U/L   Total Bilirubin 0.6 0.3 - 1.2 mg/dL   GFR calc non Af Amer >60 >60 mL/min   GFR calc Af Amer >60 >60 mL/min   Anion gap 8 5 - 15    Comment: Performed at [facility], [place], [place] [address]  Troponin I (High Sensitivity)     Status: None   Collection Time: 04/13/19 10:55 AM  Result Value Ref Range   Troponin I (High Sensitivity) 8 <18 ng/L    Comment: (NOTE) Elevated high sensitivity troponin I (hsTnI) values and significant  changes across serial measurements may suggest ACS but many other  chronic and acute conditions are known to elevate hsTnI results.  Refer to the "Links" section for chest pain algorithms and additional  guidance. Performed at [facility], [place], [place] [address]   Troponin I (High Sensitivity)     Status: None   Collection Time: 04/13/19  1:52 PM  Result Value Ref Range   Troponin I (High Sensitivity) 5 <18 ng/L    Comment: (NOTE) Elevated high sensitivity troponin I (hsTnI) values and significant  changes across serial measurements may suggest ACS but many other  chronic and acute conditions are known to elevate hsTnI results.  Refer to the "Links" section for chest pain algorithms and additional  guidance. Performed at [facility], [place], [place] [address]    Ct Head Wo Contrast  Result Date: 04/13/2019 CLINICAL DATA:  83 year old female with history of syncope. EXAM: CT HEAD WITHOUT CONTRAST TECHNIQUE: Contiguous axial images were obtained from the base of the skull through the vertex without intravenous contrast. COMPARISON:  Head CT 03/31/2019. FINDINGS: Brain: Patchy and confluent areas of decreased attenuation are noted throughout the deep and periventricular white matter of the cerebral hemispheres bilaterally, compatible with severe chronic microvascular ischemic disease. No evidence of acute infarction, hemorrhage, hydrocephalus, extra-axial collection  or mass lesion/mass effect. Vascular: No hyperdense vessel or unexpected calcification. Skull: Normal. Negative for fracture or focal lesion. Sinuses/Orbits: No acute finding. Other: None. IMPRESSION: 1. No acute intracranial abnormalities. 2. Severe chronic microvascular ischemic changes in the cerebral white matter redemonstrated, as above. Electronically Signed   By: Mauri Brooklyn.D.  On: 04/13/2019 11:41   Dg Chest Port 1 View  Result Date: 04/13/2019 CLINICAL DATA:  83 year old female with history of altered mental status. Bradycardia. EXAM: PORTABLE CHEST 1 VIEW COMPARISON:  No priors. FINDINGS: Lung volumes are normal. No consolidative airspace disease. No pleural effusions. Diffuse peribronchial cuffing. No evidence of pulmonary edema. Heart size is mildly enlarged. The patient is rotated to the right on today's exam, resulting in distortion of the mediastinal contours and reduced diagnostic sensitivity and specificity for mediastinal pathology. Aortic atherosclerosis. IMPRESSION: 1. Mild diffuse peribronchial cuffing which may suggest an acute bronchitis. 2. Cardiomegaly. 3. Aortic atherosclerosis. Electronically Signed   By: Vinnie Langton M.D.   On: 04/13/2019 11:52    Pending Labs Unresulted Labs (From admission, onward)    Start     Ordered   04/13/19 1240  SARS CORONAVIRUS 2 Nasal Swab Aptima Multi Swab  (Asymptomatic/Tier 2 Patients Labs)  ONCE - STAT,   STAT    Question Answer Comment  Is this test for diagnosis or screening Screening   Symptomatic for COVID-19 as defined by CDC No   Hospitalized for COVID-19 No   Admitted to ICU for COVID-19 No   Previously tested for COVID-19 No   Resident in a congregate (group) care setting No   Employed in healthcare setting No   Pregnant No      04/13/19 1239   Signed and Held  Basic metabolic panel  Tomorrow morning,   R     Signed and Held   Signed and Held  CBC  Tomorrow morning,   R     Signed and Held   Signed and Held  CBC   (enoxaparin (LOVENOX)    CrCl >/= 30 ml/min)  Once,   R    Comments: Baseline for enoxaparin therapy IF NOT ALREADY DRAWN.  Notify MD if PLT < 100 K.    Signed and Held   Signed and Held  Creatinine, serum  (enoxaparin (LOVENOX)    CrCl >/= 30 ml/min)  Once,   R    Comments: Baseline for enoxaparin therapy IF NOT ALREADY DRAWN.    Signed and Held   Signed and Held  Creatinine, serum  (enoxaparin (LOVENOX)    CrCl >/= 30 ml/min)  Weekly,   R    Comments: while on enoxaparin therapy    Signed and Held          Vitals/Pain Today's Vitals   04/13/19 1500 04/13/19 1530 04/13/19 1600 04/13/19 1630  BP: (!) 165/63 (!) 144/74 (!) 159/75 (!) 165/69  Pulse: (!) 56 (!) 53 (!) 58 61  Resp: 14 16 18 20   SpO2: 94%  94% 94%  Weight:      Height:      PainSc:        Isolation Precautions No active isolations  Medications Medications - No data to display  Mobility walks with walker Low fall risk   Focused Assessments Neuro Assessment Handoff:                R Recommendations: See Admitting Provider Note  Report given to:   Additional Notes: Pt sons Marya Amsler and Ronalee Belts would like to be updated frequently.

## 2019-04-13 NOTE — ED Notes (Signed)
Her sons, Marya Amsler and Ronalee Belts both reported they would like frequent updates.

## 2019-04-13 NOTE — ED Triage Notes (Signed)
Pt husband called EMS due to pt not waking up from chair. Pt complains of lower back pain.

## 2019-04-13 NOTE — H&P (Signed)
[facility] [place] NAME: Crystal Burch    MR#:  [id number]  DATE OF BIRTH:  25-Jan-1936  DATE OF ADMISSION:  04/13/2019  PRIMARY CARE PHYSICIAN: [place]   REQUESTING/REFERRING PHYSICIAN: Dr. Derrell Lolling  CHIEF COMPLAINT:   Chief Complaint  Patient presents with   Altered Mental Status   Back Pain    HISTORY OF PRESENT ILLNESS:  Crystal Burch  is a 83 y.o. female with a known history of dementia, essential hypertension, GERD, neuropathy, hyperlipidemia who presented to the hospital due to a syncopal episode.  Patient herself cannot recall the events therefore most of the history obtained from the chart and from the ER physician.  Patient apparently was in her usual state of health when this morning she had a syncopal episode and is by her husband and she was unresponsive for close to 10 minutes.  She had a pulse and was breathing but was not responsive.  She was sent to the ER for further evaluation.  Emergency room patient underwent CT head, routine blood work which are essentially benign.  Given her syncopal episode with no significant previous history of syncope hospital services were contacted for admission.  Patient denies any fevers, chills, nausea, vomiting, chest pain headache dizziness or any other associated symptoms presently.  PAST MEDICAL HISTORY:   Past Medical History:  Diagnosis Date   Dementia ([place])    Hypertension     PAST SURGICAL HISTORY:   Past Surgical History:  Procedure Laterality Date   ABDOMINAL HYSTERECTOMY      SOCIAL HISTORY:   Social History   Tobacco Use   Smoking status: Never Smoker   Smokeless tobacco: Never Used  Substance Use Topics   Alcohol use: Not Currently    FAMILY HISTORY:   Family History  Problem Relation Age of Onset   Stroke Mother    Hypertension Mother    Stroke Father    Hypertension Father    Stroke Brother    Hypertension Brother      DRUG ALLERGIES:  No Known Allergies  REVIEW OF SYSTEMS:   Review of Systems  Constitutional: Negative for fever and weight loss.  HENT: Negative for congestion, nosebleeds and tinnitus.   Eyes: Negative for blurred vision, double vision and redness.  Respiratory: Negative for cough, hemoptysis and shortness of breath.   Cardiovascular: Negative for chest pain, orthopnea, leg swelling and PND.  Gastrointestinal: Negative for abdominal pain, diarrhea, melena, nausea and vomiting.  Genitourinary: Negative for dysuria, hematuria and urgency.  Musculoskeletal: Negative for falls and joint pain.  Neurological: Negative for dizziness, tingling, sensory change, focal weakness, seizures, weakness and headaches.  Endo/Heme/Allergies: Negative for polydipsia. Does not bruise/bleed easily.  Psychiatric/Behavioral: Negative for depression and memory loss. The patient is not nervous/anxious.     MEDICATIONS AT HOME:   Prior to Admission medications   Medication Sig Start Date End Date Taking? Authorizing Provider  aspirin EC 81 MG tablet Take 81 mg by mouth daily.   Yes [provider]  atorvastatin (LIPITOR) 40 MG tablet Take 40 mg by mouth daily.    Yes [provider]  citalopram (CELEXA) 10 MG tablet Take 10 mg by mouth daily. 10/08/14  Yes [provider]  donepezil (ARICEPT) 10 MG tablet Take 10 mg by mouth every evening.    Yes [provider]  gabapentin (NEURONTIN) 300 MG capsule Take 600 mg by mouth daily. 02/03/14  Yes [provider]  lisinopril (ZESTRIL) 5 MG tablet Take 7.5 mg by mouth at bedtime. 09/21/18  Yes [provider]  meloxicam (MOBIC) 7.5 MG tablet Take 7.5 mg by mouth daily. 12/21/16  Yes [provider]  omeprazole (PRILOSEC) 20 MG capsule Take 20 mg by mouth daily.   Yes [provider]  oxybutynin (DITROPAN) 5 MG tablet Take 5 mg by mouth 2 (two) times daily. 01/20/14  Yes [provider]   vitamin B-12 (CYANOCOBALAMIN) 1000 MCG tablet Take 1,000 mcg by mouth daily.   Yes [provider]      VITAL SIGNS:  Blood pressure (!) 158/74, pulse (!) 57, resp. rate 15, height 5' (1.524 m), weight 59 kg, SpO2 93 %.  PHYSICAL EXAMINATION:  Physical Exam  GENERAL:  83 y.o.-year-old patient lying in the bed in no acute distress.  EYES: Pupils equal, round, reactive to light and accommodation. No scleral icterus. Extraocular muscles intact.  HEENT: Head atraumatic, normocephalic. Oropharynx and nasopharynx clear. No oropharyngeal erythema, moist oral mucosa  NECK:  Supple, no jugular venous distention. No thyroid enlargement, no tenderness.  LUNGS: Normal breath sounds bilaterally, no wheezing, rales, rhonchi. No use of accessory muscles of respiration.  CARDIOVASCULAR: S1, S2 RRR. No murmurs, rubs, gallops, clicks.  ABDOMEN: Soft, nontender, nondistended. Bowel sounds present. No organomegaly or mass.  EXTREMITIES: No pedal edema, cyanosis, or clubbing. + 2 pedal & radial pulses b/l.   NEUROLOGIC: Cranial nerves II through XII are intact. No focal Motor or sensory deficits appreciated b/l PSYCHIATRIC: The patient is alert and oriented x 3.  SKIN: No obvious rash, lesion, or ulcer.   LABORATORY PANEL:   CBC Recent Labs  Lab 04/13/19 1055  WBC 5.9  HGB 12.6  HCT 39.8  PLT 139*   ------------------------------------------------------------------------------------------------------------------  Chemistries  Recent Labs  Lab 04/13/19 1055  NA 140  K 4.4  CL 106  CO2 26  GLUCOSE 112*  BUN 21  CREATININE 0.64  CALCIUM 9.1  AST 25  ALT 19  ALKPHOS 53  BILITOT 0.6   ------------------------------------------------------------------------------------------------------------------  Cardiac Enzymes No results for input(s): TROPONINI in the last 168  hours. ------------------------------------------------------------------------------------------------------------------  RADIOLOGY:  Ct Head Wo Contrast  Result Date: 04/13/2019 CLINICAL DATA:  83 year old female with history of syncope. EXAM: CT HEAD WITHOUT CONTRAST TECHNIQUE: Contiguous axial images were obtained from the base of the skull through the vertex without intravenous contrast. COMPARISON:  Head CT 03/31/2019. FINDINGS: Brain: Patchy and confluent areas of decreased attenuation are noted throughout the deep and periventricular white matter of the cerebral hemispheres bilaterally, compatible with severe chronic microvascular ischemic disease. No evidence of acute infarction, hemorrhage, hydrocephalus, extra-axial collection or mass lesion/mass effect. Vascular: No hyperdense vessel or unexpected calcification. Skull: Normal. Negative for fracture or focal lesion. Sinuses/Orbits: No acute finding. Other: None. IMPRESSION: 1. No acute intracranial abnormalities. 2. Severe chronic microvascular ischemic changes in the cerebral white matter redemonstrated, as above. Electronically Signed   By: Vinnie Langton M.D.   On: 04/13/2019 11:41   Dg Chest Port 1 View  Result Date: 04/13/2019 CLINICAL DATA:  83 year old female with history of altered mental status. Bradycardia. EXAM: PORTABLE CHEST 1 VIEW COMPARISON:  No priors. FINDINGS: Lung volumes are normal. No consolidative airspace disease. No pleural effusions. Diffuse peribronchial cuffing. No evidence of pulmonary edema. Heart size is mildly enlarged. The patient is rotated to the right on today's exam, resulting in distortion of the mediastinal contours and reduced diagnostic sensitivity and specificity for mediastinal pathology. Aortic atherosclerosis. IMPRESSION: 1.  Mild diffuse peribronchial cuffing which may suggest an acute bronchitis. 2. Cardiomegaly. 3. Aortic atherosclerosis. Electronically Signed   By: Vinnie Langton M.D.   On:  04/13/2019 11:52     IMPRESSION AND PLAN:   83 y.o. female with a known history of dementia, essential hypertension, GERD, neuropathy, hyperlipidemia who presented to the hospital due to a syncopal episode.  1.  Syncope- source of the syncope seems unclear presently.  Patient had no prodromal symptoms prior to her episode. - We will observe overnight on telemetry watch for any arrhythmias.  Check echocardiogram, carotid duplex.  CT head was negative for acute pathology.  Unlikely this is seizures. - We will check orthostatic vital signs in the morning.  2.  Essential hypertension-hemodynamically stable.  Continue lisinopril.  3.  History of dementia-continue Aricept.  4.  Hyperlipidemia-continue atorvastatin.  5.  GERD-continue Protonix.  6.  Urinary incontinence-continue Ditropan.  7.  Neuropathy-continue gabapentin.    All the records are reviewed and case discussed with ED provider. Management plans discussed with the patient, family and they are in agreement.  CODE STATUS: Full code  TOTAL TIME TAKING CARE OF THIS PATIENT: 40 minutes.    Henreitta Leber M.D on 04/13/2019 at 1:37 PM  Between 7am to 6pm - Pager - [phone]  After 6pm go to [email or web address] - password [email or web address] [place] [facility]  Office  [phone]  CC: Primary care physician; [place]

## 2019-04-13 NOTE — ED Notes (Signed)
Patient transported to CT 

## 2019-04-13 NOTE — ED Provider Notes (Signed)
[facility] Emergency Department Provider Note  ____________________________________________   First MD Initiated Contact with Patient 04/13/19 1049     (approximate)  I have reviewed the triage vital signs and the nursing notes.  History  Chief Complaint Altered Mental Status and Back Pain    HPI Crystal Burch is a 83 y.o. female history of dementia, HTN who presents for an episode of unresponsiveness.  Patient does not recall the event at all.  She states she was feeling at her baseline, when she woke up in the EMS truck.  At present she denies any chest pain, difficulty breathing, lightheadedness, pain, or any other complaints.  History limited due to patient's dementia, lack of recollection of event.  Obtained from EMS and patient's son via phone.  Per report the episode happened at around 9:50 AM. Friend brought her breakfast and the left to go inside. When they returned she was unresponsive and wouldn't wake up. In total the episode maybe less than 20 minutes. Was immediately alert and oriented afterwards.  No hx seizures. Never passed out or lost consciousness before.          Past Medical Hx Past Medical History:  Diagnosis Date  . Dementia ([place])   . Hypertension     Problem List There are no active problems to display for this patient.   Past Surgical Hx Past Surgical History:  Procedure Laterality Date  . ABDOMINAL HYSTERECTOMY      Medications Prior to Admission medications   Not on File    Allergies Patient has no known allergies.  Family Hx History reviewed. No pertinent family history.  Social Hx Social History   Tobacco Use  . Smoking status: Never Smoker  . Smokeless tobacco: Never Used  Substance Use Topics  . Alcohol use: Not Currently  . Drug use: Not on file     Review of Systems  Constitutional: Negative for fever. Negative for chills. Eyes: Negative for visual changes. ENT: Negative for sore  throat. Cardiovascular: Negative for chest pain. Respiratory: Negative for shortness of breath. Gastrointestinal: Negative for abdominal pain. Negative for nausea. Negative for vomiting. Genitourinary: Negative for dysuria. Musculoskeletal: Negative for leg swelling. Skin: Negative for rash. Neurological: + syncope   Physical Exam  Vital Signs: ED Triage Vitals  Enc Vitals Group     BP 04/13/19 1016 126/63     Pulse Rate 04/13/19 1016 65     Resp 04/13/19 1016 16     Temp --      Temp src --      SpO2 04/13/19 1015 91 %     Weight 04/13/19 1017 130 lb (59 kg)     Height 04/13/19 1017 5' (1.524 m)     Head Circumference --      Peak Flow --      Pain Score 04/13/19 1017 5     Pain Loc --      Pain Edu? --      Excl. in [place]? --     Constitutional: Alert and oriented.  Eyes: Conjunctivae clear. Sclera anicteric. Head: Normocephalic. Atraumatic. Nose: No congestion. No rhinorrhea. Mouth/Throat: Mucous membranes are moist.  Neck: No stridor.   Cardiovascular: Normal rate, regular rhythm. No murmurs. Extremities well perfused. Respiratory: Normal respiratory effort.  Lungs CTAB. Gastrointestinal: Soft and non-tender. No distention.  Musculoskeletal: No lower extremity edema. Neurologic:  Normal speech and language. No gross focal neurologic deficits are appreciated.  Skin: Skin is warm, dry and intact.  No rash noted. Psychiatric: Mood and affect are appropriate for situation.  EKG  Personally reviewed.   Rate: 58 Rhythm: sinus Axis: LAD Intervals: WNL No acute ischemic changes.  No evidence of Brugada or Wolff-Parkinson-White. Sinus bradycardia.     Radiology  CTH: no acute hemorrhage or obvious mass  Procedures  Procedure(s) performed (including critical care):  Procedures   Initial Impression / Assessment and Plan / ED Course  83 y.o. female history of dementia, HTN who presents for an episode of unresponsiveness/syncope.   On exam, she is  bradycardic, but otherwise VS WNL. No focal neurological deficits.   Unlikely to be seizures based on description.  Consider arrhythmogenic syncope, evidence of sinus bradycardia on EKG, but no other arrhythmias identified on 12-lead.  Labs largely unremarkable.  No acute abnormalities on CT head.  Discussed with hospitalist for admission for further work-up of syncope, and telemetry monitoring.  Will admit.    Final Clinical Impression(s) / ED Diagnosis  Final diagnoses:  Bradycardia  Loss of consciousness ([place])  Syncope       Note:  This document was prepared using Dragon voice recognition software and may include unintentional dictation errors.   Lilia Pro., MD 04/13/19 [phone]

## 2019-04-14 ENCOUNTER — Observation Stay (HOSPITAL_BASED_OUTPATIENT_CLINIC_OR_DEPARTMENT_OTHER)
Admit: 2019-04-14 | Discharge: 2019-04-14 | Disposition: A | Discharge status: Home / Self Care | Payer: Medicare (Managed Care) | Attending: Specialist | Admitting: Specialist

## 2019-04-14 ENCOUNTER — Observation Stay: Payer: Medicare (Managed Care)

## 2019-04-14 DIAGNOSIS — I340 Nonrheumatic mitral (valve) insufficiency: Secondary | ICD-10-CM

## 2019-04-14 DIAGNOSIS — I361 Nonrheumatic tricuspid (valve) insufficiency: Secondary | ICD-10-CM

## 2019-04-14 LAB — CBC
HCT: 38.1 % (ref 36.0–46.0)
Hemoglobin: 12 g/dL (ref 12.0–15.0)
MCH: 29.1 pg (ref 26.0–34.0)
MCHC: 31.5 g/dL (ref 30.0–36.0)
MCV: 92.5 fL (ref 80.0–100.0)
Platelets: 127 10*3/uL — ABNORMAL LOW (ref 150–400)
RBC: 4.12 MIL/uL (ref 3.87–5.11)
RDW: 15 % (ref 11.5–15.5)
WBC: 5.2 10*3/uL (ref 4.0–10.5)
nRBC: 0 % (ref 0.0–0.2)

## 2019-04-14 LAB — BASIC METABOLIC PANEL
Anion gap: 6 (ref 5–15)
BUN: 19 mg/dL (ref 8–23)
CO2: 28 mmol/L (ref 22–32)
Calcium: 9.2 mg/dL (ref 8.9–10.3)
Chloride: 111 mmol/L (ref 98–111)
Creatinine, Ser: 0.7 mg/dL (ref 0.44–1.00)
GFR calc Af Amer: >60 mL/min (ref >60)
GFR calc non Af Amer: >60 mL/min (ref >60)
Glucose, Bld: 101 mg/dL — ABNORMAL HIGH (ref 70–99)
Potassium: 4.3 mmol/L (ref 3.5–5.1)
Sodium: 145 mmol/L (ref 135–145)

## 2019-04-14 LAB — ECHOCARDIOGRAM COMPLETE
Height: 60 in
Weight: 2150.4 oz

## 2019-04-14 LAB — SARS CORONAVIRUS 2 (TAT 6-24 HRS): SARS Coronavirus 2: NEGATIVE

## 2019-04-14 NOTE — Discharge Summary (Signed)
[facility] [place] NAME: Crystal Burch    MR#:  [id number]  DATE OF BIRTH:  [birth year]  DATE OF ADMISSION:  04/13/2019 ADMITTING PHYSICIAN: Henreitta Leber, MD  DATE OF DISCHARGE: 04/14/2019  PRIMARY CARE PHYSICIAN: [place]    ADMISSION DIAGNOSIS:  Bradycardia [R00.1] Loss of consciousness ([place]) [R40.20] Syncope [R55]  DISCHARGE DIAGNOSIS:  Active Problems:   Syncope   SECONDARY DIAGNOSIS:   Past Medical History:  Diagnosis Date  . Dementia ([place])   . Hypertension     HOSPITAL COURSE:   83 y.o. female with a known history of dementia, essential hypertension, GERD, neuropathy, hyperlipidemia who presented to the hospital due to a syncopal episode.  1.  Syncope-  patient source of syncope is unclear but suspected to be secondary to vasovagal episode.  Patient was observed on telemetry overnight without any evidence of arrhythmias, patient's carotid duplex showed no hemodynamically significant carotid artery stenosis. -CT head was also negative for acute pathology on admission.  Patient has had no seizure activity while in the hospital. -Her orthostatic vital signs are also negative.  She has been ambulating and doing well and therefore being discharged home with outpatient follow-up.  2.  Essential hypertension-hemodynamically stable.  she will Continue lisinopril.  3.  History of dementia- pt. Will continue Aricept.  4.  Hyperlipidemia-pt. Will continue atorvastatin.  5.  GERD- pt. Will continue Protonix.  6.  Urinary incontinence-pt. Will continue Ditropan.  7.  Neuropathy- pt. Will continue gabapentin.  DISCHARGE CONDITIONS:   Stable.   CONSULTS OBTAINED:    DRUG ALLERGIES:  No Known Allergies  DISCHARGE MEDICATIONS:   Allergies as of 04/14/2019   No Known Allergies     Medication List    TAKE these medications   aspirin EC 81 MG tablet Take 81 mg by mouth daily.   atorvastatin 40  MG tablet Commonly known as: LIPITOR Take 40 mg by mouth daily.   citalopram 10 MG tablet Commonly known as: CELEXA Take 10 mg by mouth daily.   donepezil 10 MG tablet Commonly known as: ARICEPT Take 10 mg by mouth every evening.   gabapentin 300 MG capsule Commonly known as: NEURONTIN Take 600 mg by mouth daily.   lisinopril 5 MG tablet Commonly known as: ZESTRIL Take 7.5 mg by mouth at bedtime.   meloxicam 7.5 MG tablet Commonly known as: MOBIC Take 7.5 mg by mouth daily.   omeprazole 20 MG capsule Commonly known as: PRILOSEC Take 20 mg by mouth daily.   oxybutynin 5 MG tablet Commonly known as: DITROPAN Take 5 mg by mouth 2 (two) times daily.   vitamin B-12 1000 MCG tablet Commonly known as: CYANOCOBALAMIN Take 1,000 mcg by mouth daily.         DISCHARGE INSTRUCTIONS:   DIET:  Cardiac diet  DISCHARGE CONDITION:  Stable  ACTIVITY:  Activity as tolerated  OXYGEN:  Home Oxygen: No.   Oxygen Delivery: room air  DISCHARGE LOCATION:  home   If you experience worsening of your admission symptoms, develop shortness of breath, life threatening emergency, suicidal or homicidal thoughts you must seek medical attention immediately by calling 911 or calling your MD immediately  if symptoms less severe.  You Must read complete instructions/literature along with all the possible adverse reactions/side effects for all the Medicines you take and that have been prescribed to you. Take any new Medicines after you have completely understood and accpet all the possible adverse reactions/side  effects.   Please note  You were cared for by a hospitalist during your hospital stay. If you have any questions about your discharge medications or the care you received while you were in the hospital after you are discharged, you can call the unit and asked to speak with the hospitalist on call if the hospitalist that took care of you is not available. Once you are discharged,  your primary care physician will handle any further medical issues. Please note that NO REFILLS for any discharge medications will be authorized once you are discharged, as it is imperative that you return to your primary care physician (or establish a relationship with a primary care physician if you do not have one) for your aftercare needs so that they can reassess your need for medications and monitor your lab values.     Today   No acute events overnight, not orthostatic, no acute seizures or any further syncopal episodes overnight.  Will discharge home today.  VITAL SIGNS:  Blood pressure (!) 163/69, pulse 64, temperature (!) 97.5 F (36.4 C), temperature source Oral, resp. rate 16, height 5' (1.524 m), weight 61 kg, SpO2 98 %.  I/O:    Intake/Output Summary (Last 24 hours) at 04/14/2019 1449 Last data filed at 04/14/2019 0830 Gross per 24 hour  Intake -  Output 430 ml  Net -430 ml    PHYSICAL EXAMINATION:  GENERAL:  83 y.o.-year-old patient lying in the bed with no acute distress.  EYES: Pupils equal, round, reactive to light and accommodation. No scleral icterus. Extraocular muscles intact.  HEENT: Head atraumatic, normocephalic. Oropharynx and nasopharynx clear.  NECK:  Supple, no jugular venous distention. No thyroid enlargement, no tenderness.  LUNGS: Normal breath sounds bilaterally, no wheezing, rales,rhonchi. No use of accessory muscles of respiration.  CARDIOVASCULAR: S1, S2 normal. No murmurs, rubs, or gallops.  ABDOMEN: Soft, non-tender, non-distended. Bowel sounds present. No organomegaly or mass.  EXTREMITIES: No pedal edema, cyanosis, or clubbing.  NEUROLOGIC: Cranial nerves II through XII are intact. No focal motor or sensory defecits b/l.  PSYCHIATRIC: The patient is alert and oriented x 3.  SKIN: No obvious rash, lesion, or ulcer.   DATA REVIEW:   CBC Recent Labs  Lab 04/14/19 0605  WBC 5.2  HGB 12.0  HCT 38.1  PLT 127*    Chemistries  Recent Labs   Lab 04/13/19 1055 04/14/19 0605  NA 140 145  K 4.4 4.3  CL 106 111  CO2 26 28  GLUCOSE 112* 101*  BUN 21 19  CREATININE 0.64 0.70  CALCIUM 9.1 9.2  AST 25  --   ALT 19  --   ALKPHOS 53  --   BILITOT 0.6  --     Cardiac Enzymes No results for input(s): TROPONINI in the last 168 hours.  Microbiology Results  Results for orders placed or performed during the hospital encounter of 04/13/19  SARS CORONAVIRUS 2 Nasal Swab Aptima Multi Swab     Status: None   Collection Time: 04/13/19  1:06 PM   Specimen: Aptima Multi Swab; Nasal Swab  Result Value Ref Range Status   SARS Coronavirus 2 NEGATIVE NEGATIVE Final    Comment: (NOTE) SARS-CoV-2 target nucleic acids are NOT DETECTED. The SARS-CoV-2 RNA is generally detectable in upper and lower respiratory specimens during the acute phase of infection. Negative results do not preclude SARS-CoV-2 infection, do not rule out co-infections with other pathogens, and should not be used as the sole basis for treatment or  other patient management decisions. Negative results must be combined with clinical observations, patient history, and epidemiological information. The expected result is Negative. Fact Sheet for Patients: SugarRoll.be Fact Sheet for Healthcare Providers: https://www.woods-mathews.com/ This test is not yet approved or cleared by the [place] FDA and  has been authorized for detection and/or diagnosis of SARS-CoV-2 by FDA under an Emergency Use Authorization (EUA). This EUA will remain  in effect (meaning this test can be used) for the duration of the COVID-19 declaration under Section 56 4(b)(1) of the Act, 21 U.S.C. section 360bbb-3(b)(1), unless the authorization is terminated or revoked sooner. Performed at [facility], [place] [address] [place], [place] [address]     RADIOLOGY:  Ct Head Wo Contrast  Result Date: 04/13/2019 CLINICAL DATA:  83 year old female  with history of syncope. EXAM: CT HEAD WITHOUT CONTRAST TECHNIQUE: Contiguous axial images were obtained from the base of the skull through the vertex without intravenous contrast. COMPARISON:  Head CT 03/31/2019. FINDINGS: Brain: Patchy and confluent areas of decreased attenuation are noted throughout the deep and periventricular white matter of the cerebral hemispheres bilaterally, compatible with severe chronic microvascular ischemic disease. No evidence of acute infarction, hemorrhage, hydrocephalus, extra-axial collection or mass lesion/mass effect. Vascular: No hyperdense vessel or unexpected calcification. Skull: Normal. Negative for fracture or focal lesion. Sinuses/Orbits: No acute finding. Other: None. IMPRESSION: 1. No acute intracranial abnormalities. 2. Severe chronic microvascular ischemic changes in the cerebral white matter redemonstrated, as above. Electronically Signed   By: Vinnie Langton M.D.   On: 04/13/2019 11:41   US Carotid Bilateral  Result Date: 04/14/2019 CLINICAL DATA:  83 year old female with a history of syncope EXAM: BILATERAL CAROTID DUPLEX ULTRASOUND TECHNIQUE: Pearline Cables scale imaging, color Doppler and duplex ultrasound were performed of bilateral carotid and vertebral arteries in the neck. COMPARISON:  None. FINDINGS: Criteria: Quantification of carotid stenosis is based on velocity parameters that correlate the residual internal carotid diameter with NASCET-based stenosis levels, using the diameter of the distal internal carotid lumen as the denominator for stenosis measurement. The following velocity measurements were obtained: RIGHT ICA:  Systolic 62 cm/sec, Diastolic 15 cm/sec CCA:  74 cm/sec SYSTOLIC ICA/CCA RATIO:  0.8 ECA:  101 cm/sec LEFT ICA:  Systolic 829 cm/sec, Diastolic 32 cm/sec CCA:  79 cm/sec SYSTOLIC ICA/CCA RATIO:  1.8 ECA:  146 cm/sec Right Brachial SBP: Not acquired Left Brachial SBP: Not acquired RIGHT CAROTID ARTERY: No significant calcifications of the right  common carotid artery. Intermediate waveform maintained. Heterogeneous and partially calcified plaque at the right carotid bifurcation. No significant lumen shadowing. Low resistance waveform of the right ICA. No significant tortuosity. RIGHT VERTEBRAL ARTERY: Antegrade flow with low resistance waveform. LEFT CAROTID ARTERY: No significant calcifications of the left common carotid artery. Intermediate waveform maintained. Heterogeneous and partially calcified plaque at the left carotid bifurcation without significant lumen shadowing. Low resistance waveform of the left ICA. No significant tortuosity. LEFT VERTEBRAL ARTERY:  Antegrade flow with low resistance waveform. IMPRESSION: Right: Color duplex indicates minimal heterogeneous and calcified plaque, with no hemodynamically significant stenosis by duplex criteria in the extracranial cerebrovascular circulation. Left: Heterogeneous and partially calcified plaque at the left carotid bifurcation, with discordant results regarding degree of stenosis by established duplex criteria. Peak velocity suggests 50%-69% stenosis, with the ICA/ CCA ratio suggesting a lesser degree of stenosis. If establishing a more accurate degree of stenosis is required, cerebral angiogram should be considered, or as a second best test, CTA. Signed, Dulcy Fanny. Earleen Newport DO, RPVI [facility] Electronically Signed   By: Corrie Mckusick D.O.   On: 04/14/2019 14:40   Dg Chest Port 1 View  Result Date: 04/13/2019 CLINICAL DATA:  83 year old female with history of altered mental status. Bradycardia. EXAM: PORTABLE CHEST 1 VIEW COMPARISON:  No priors. FINDINGS: Lung volumes are normal. No consolidative airspace disease. No pleural effusions. Diffuse peribronchial cuffing. No evidence of pulmonary edema. Heart size is mildly enlarged. The patient is rotated to the right on today's exam, resulting in distortion of the mediastinal contours and  reduced diagnostic sensitivity and specificity for mediastinal pathology. Aortic atherosclerosis. IMPRESSION: 1. Mild diffuse peribronchial cuffing which may suggest an acute bronchitis. 2. Cardiomegaly. 3. Aortic atherosclerosis. Electronically Signed   By: Vinnie Langton M.D.   On: 04/13/2019 11:52      Management plans discussed with the patient, family and they are in agreement.  CODE STATUS:     Code Status Orders  (From admission, onward)         Start     Ordered   04/13/19 1819  Full code  Continuous     04/13/19 1818        Code Status History    This patient has a current code status but no historical code status.   Advance Care Planning Activity      TOTAL TIME TAKING CARE OF THIS PATIENT: 40 minutes.    Henreitta Leber M.D on 04/14/2019 at 2:49 PM  Between 7am to 6pm - Pager - [phone]  After 6pm go to [email or web address] - [facility]  Office  [phone]  CC: Primary care physician; [facility] [place]

## 2019-04-14 NOTE — Plan of Care (Signed)
  Problem: Education: Goal: Knowledge of General Education information will improve Description: Including pain rating scale, medication(s)/side effects and non-pharmacologic comfort measures Outcome: Progressing   Problem: Safety: Goal: Ability to remain free from injury will improve Outcome: Progressing   

## 2020-05-12 IMAGING — CT CT HEAD WITHOUT CONTRAST
3 series · 15 of 44 positions shown, 18 images · non-contrast
Comparison: Head CT 03/31/2019.

CLINICAL DATA: 82-year-old female with history of syncope.

EXAM:
CT HEAD WITHOUT CONTRAST
TECHNIQUE: Contiguous axial images were obtained from the base of the skull
through the vertex without intravenous contrast.

[Series 2: head wo · axial · 0.47mm/px · z∈[-139,-29]mm · 9 of 27 slices shown, 12 images]
[im 3/27  brain]
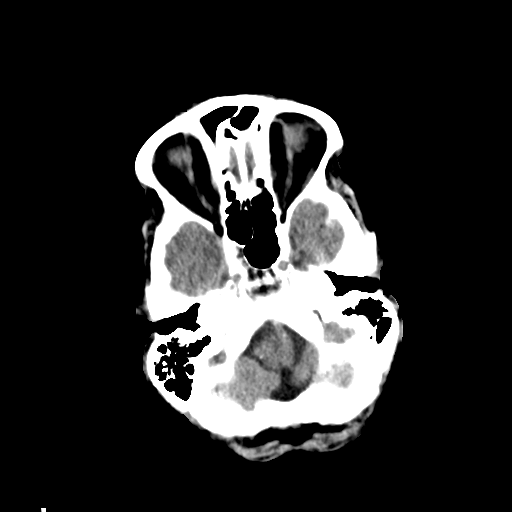
[im 3/27  bone]
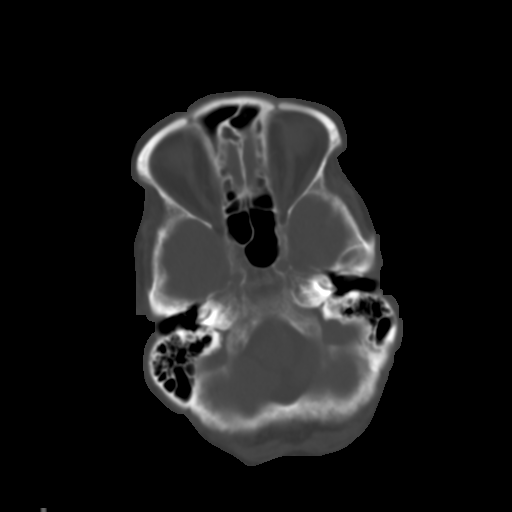
[im 6/27  brain]
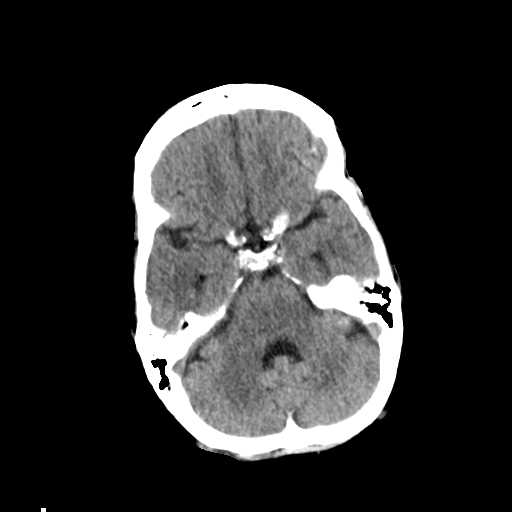
[im 8/27  brain]
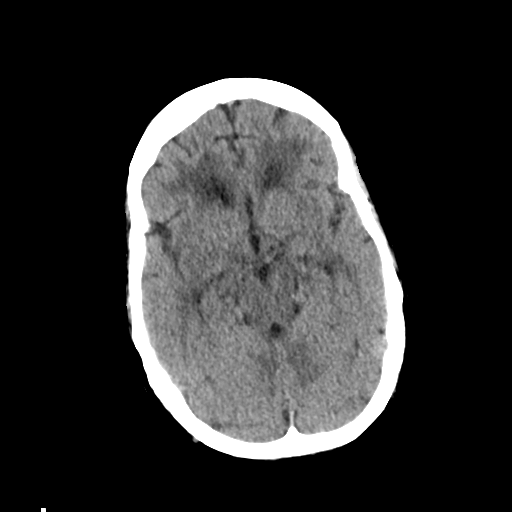
[im 11/27  brain]
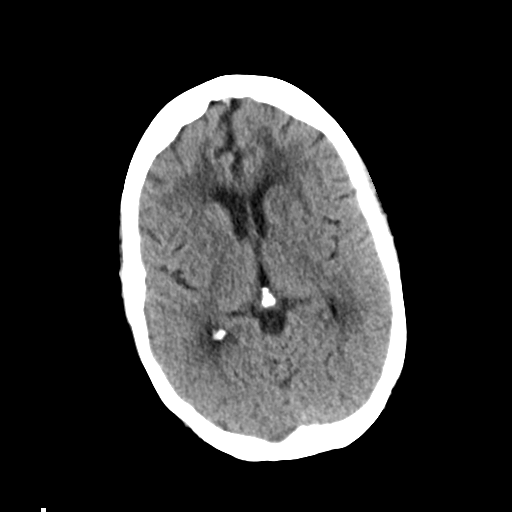
[im 14/27  brain]
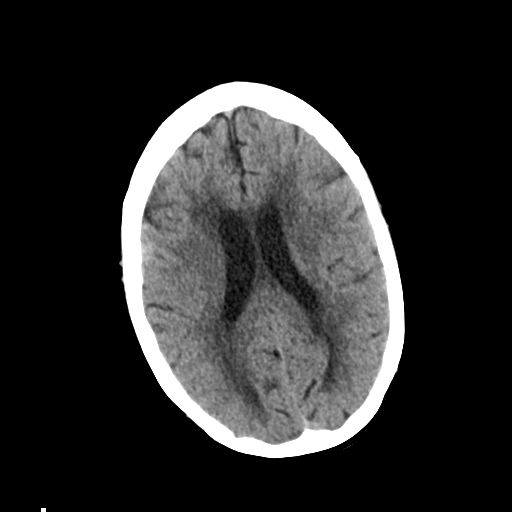
[im 14/27  bone]
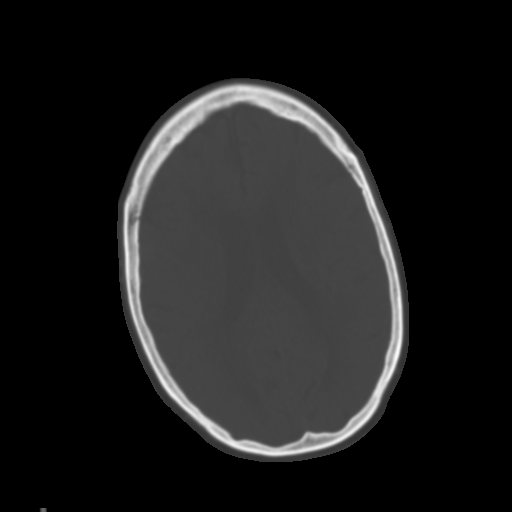
[im 17/27  brain]
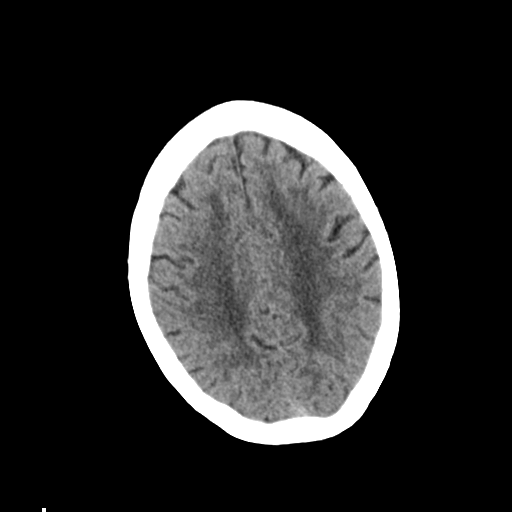
[im 20/27  brain]
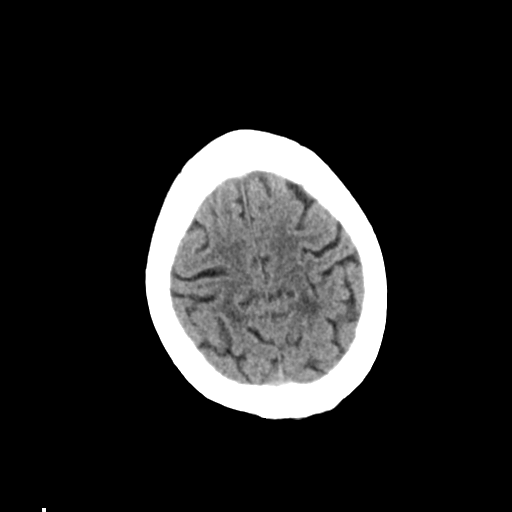
[im 22/27  brain]
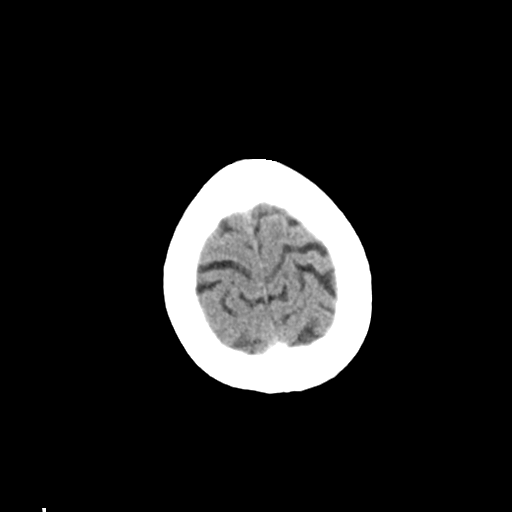
[im 25/27  brain]
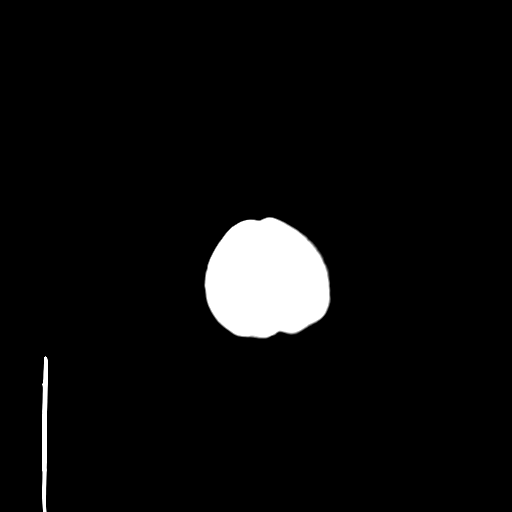
[im 25/27  bone]
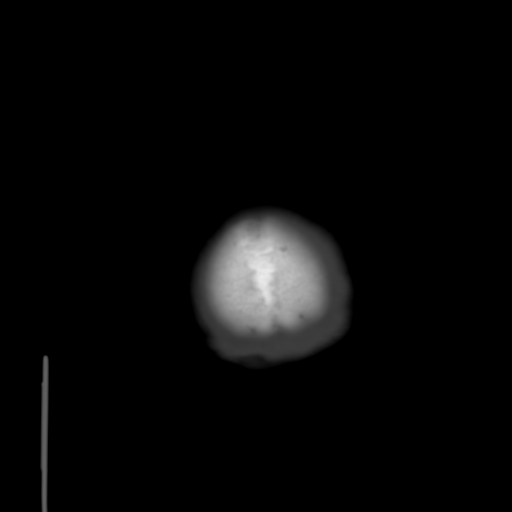

[Series 4: coronal soft tissue · coronal · 0.27mm/px · 3 of 66 slices shown]
[im 22/66  brain]
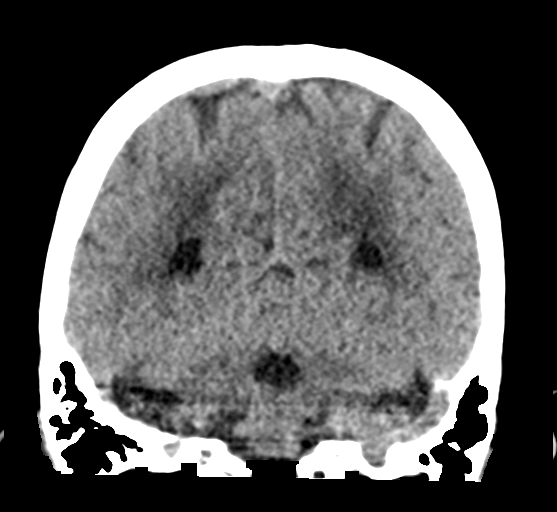
[im 29/66  brain]
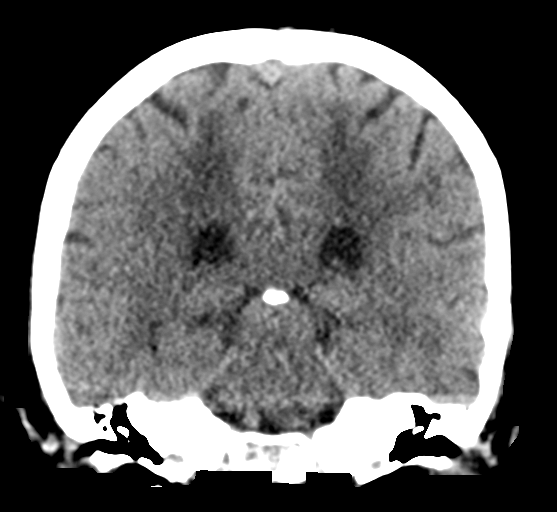
[im 37/66  brain]
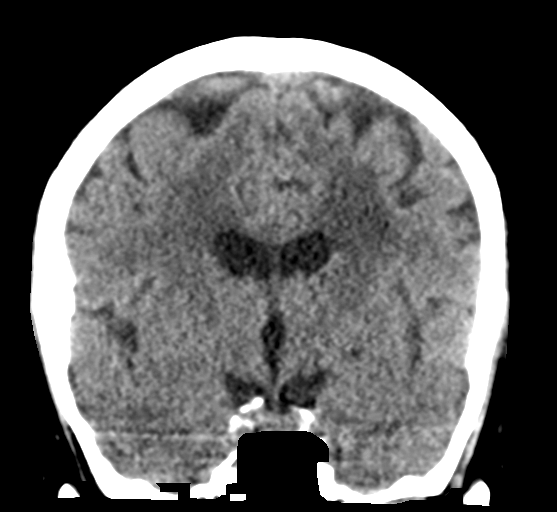

[Series 5: sagittal soft tissue · sagittal · 0.27mm/px · 3 of 50 slices shown]
[im 17/50  brain]
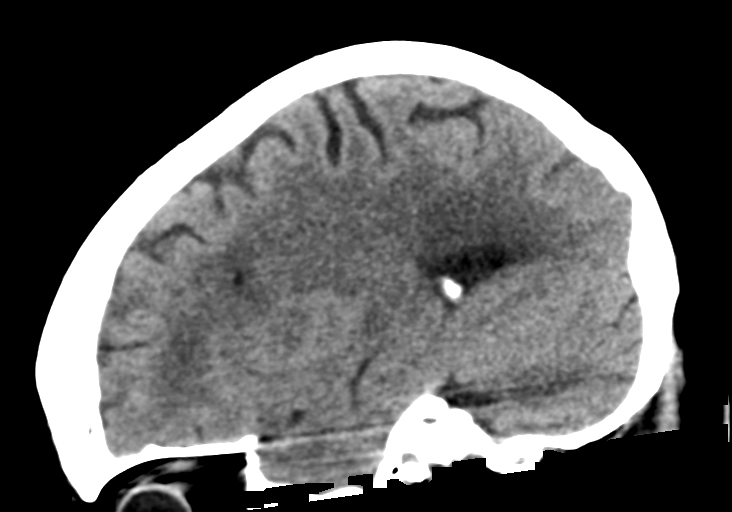
[im 25/50  brain]
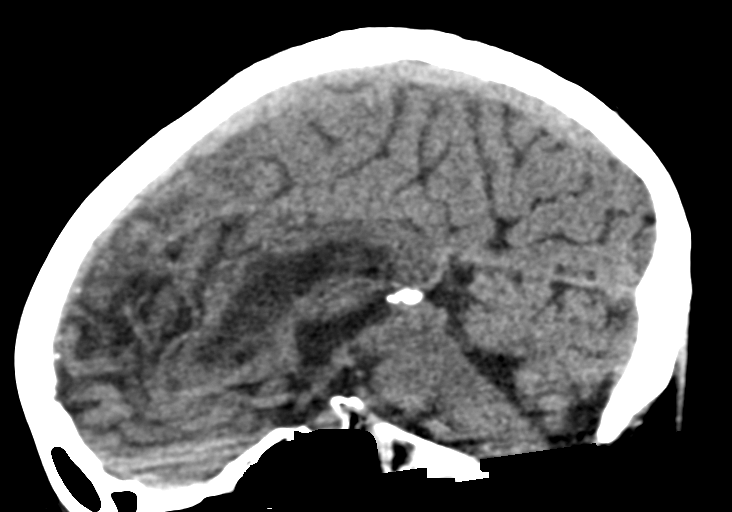
[im 33/50  brain]
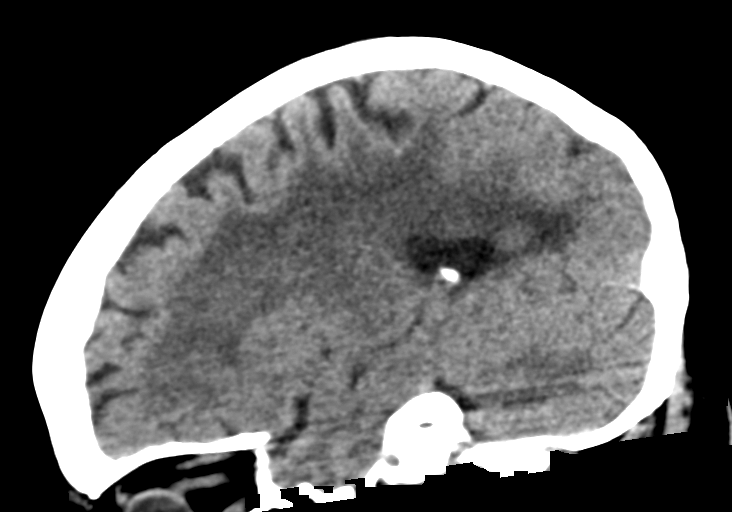

[15 of 44 positions shown; findings below may reference images not displayed]

FINDINGS: Brain: Patchy and confluent areas of decreased attenuation are noted
throughout the deep and periventricular white matter of the cerebral
hemispheres bilaterally, compatible with severe chronic
microvascular ischemic disease. No evidence of acute infarction,
hemorrhage, hydrocephalus, extra-axial collection or mass
lesion/mass effect.

Vascular: No hyperdense vessel or unexpected calcification.

Skull: Normal. Negative for fracture or focal lesion.

Sinuses/Orbits: No acute finding.

Other: None.
IMPRESSION: 1. No acute intracranial abnormalities.
2. Severe chronic microvascular ischemic changes in the cerebral
white matter redemonstrated, as above.

## 2020-05-13 IMAGING — US BILATERAL CAROTID DUPLEX ULTRASOUND
1 series · 13 of 24 positions shown · non-contrast
Comparison: None.

CLINICAL DATA: 82-year-old female with a history of syncope

EXAM:
BILATERAL CAROTID DUPLEX ULTRASOUND
TECHNIQUE: Gray scale imaging, color Doppler and duplex ultrasound were
performed of bilateral carotid and vertebral arteries in the neck.

[Series 1: bilateral carotid duplex ultrasound · 13 of 65 slices shown]
[im 1/65]
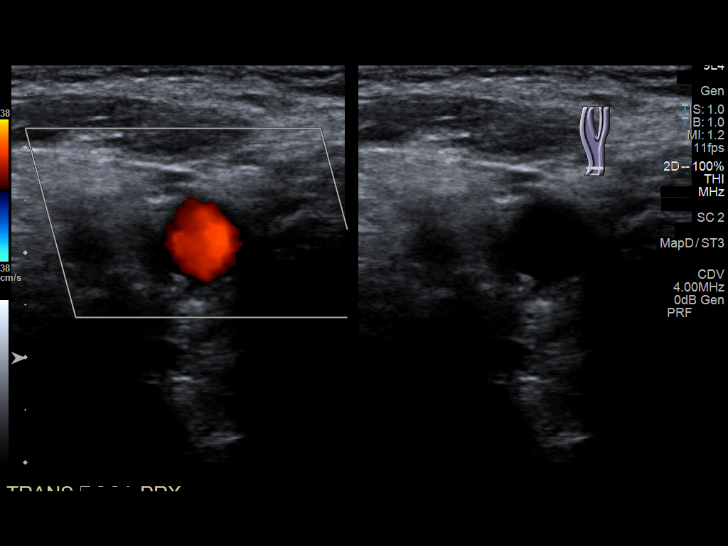
[im 6/65]
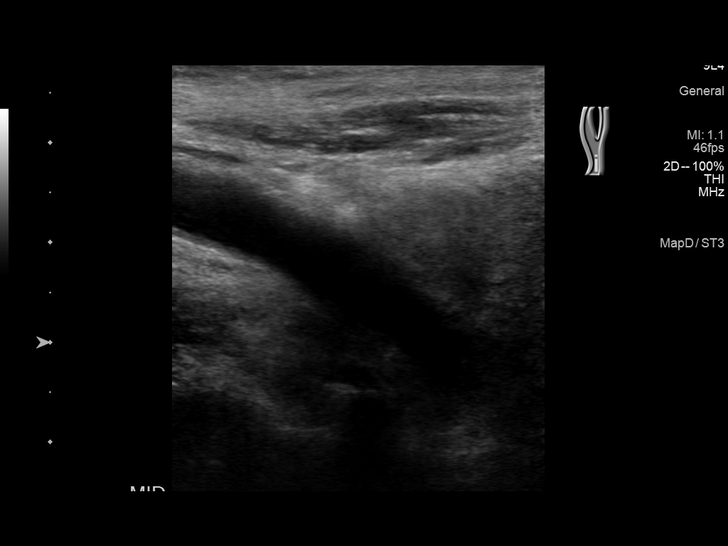
[im 12/65]
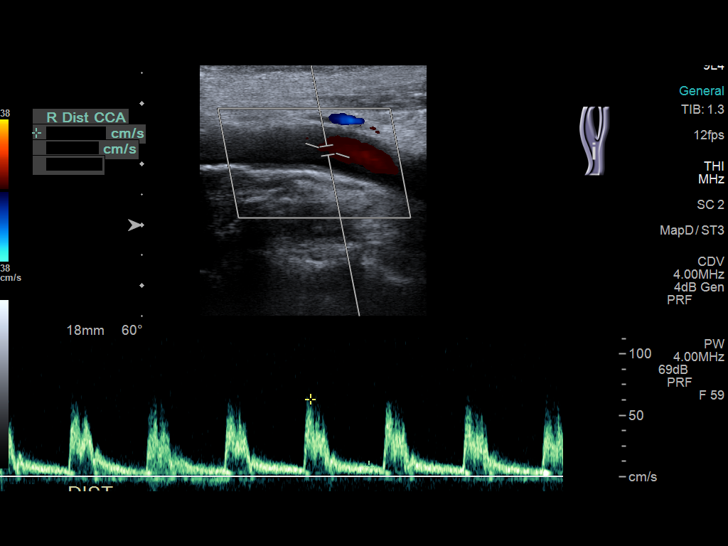
[im 17/65]
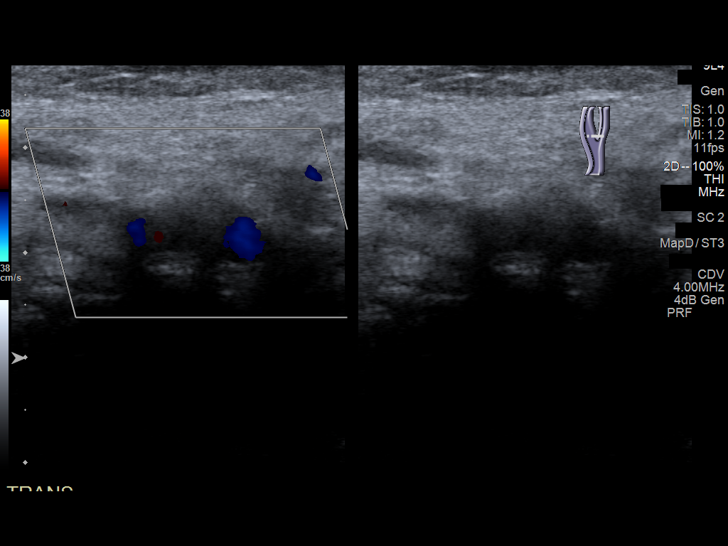
[im 23/65]
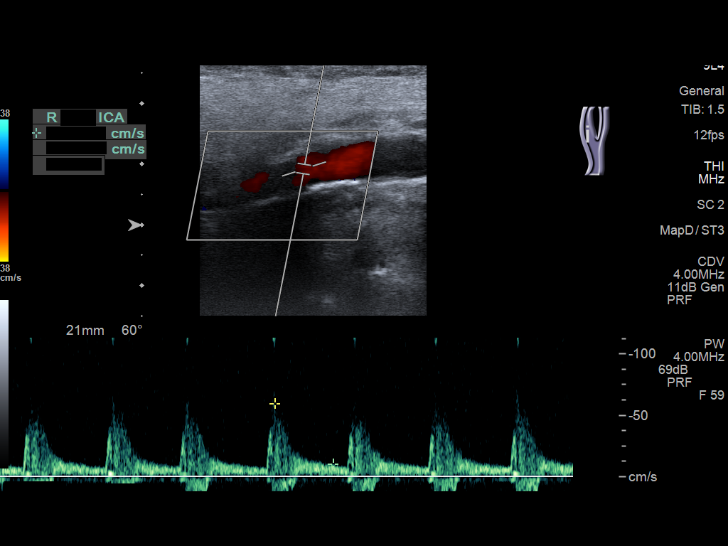
[im 28/65]
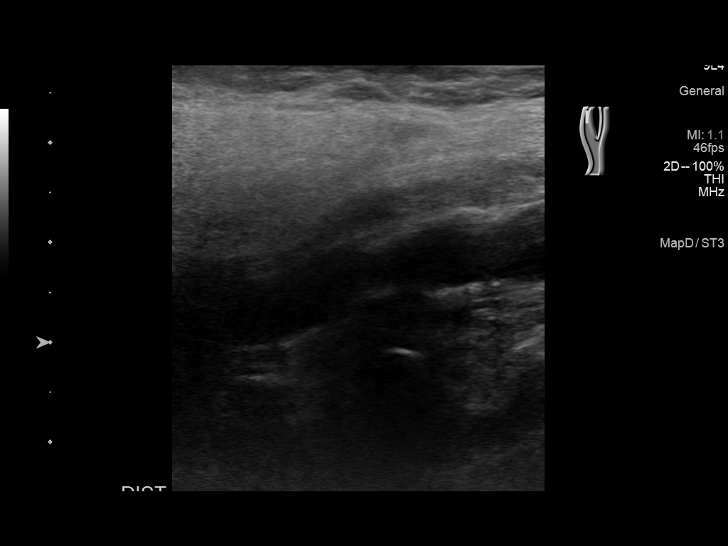
[im 34/65]
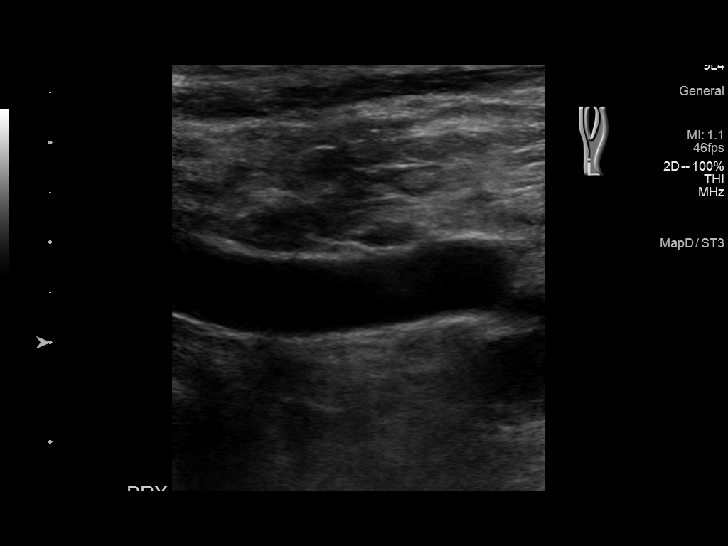
[im 37/65]
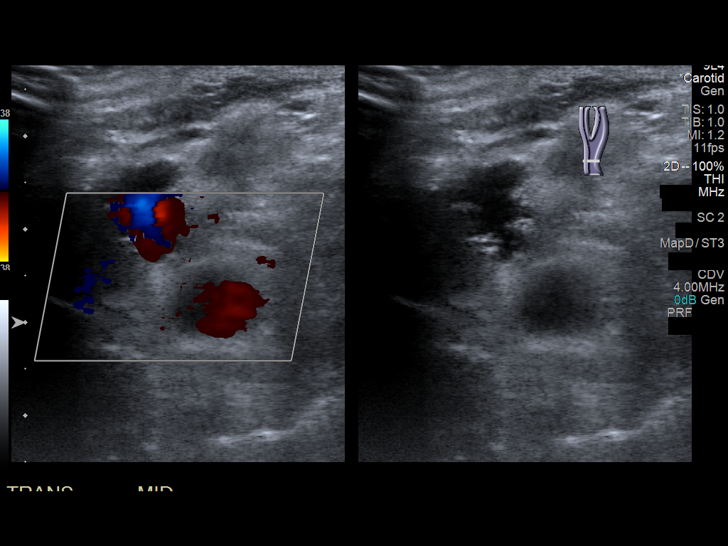
[im 42/65]
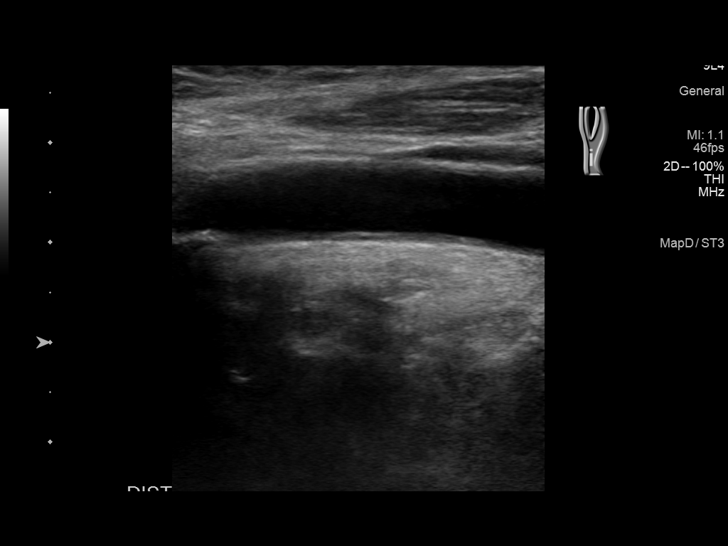
[im 48/65]
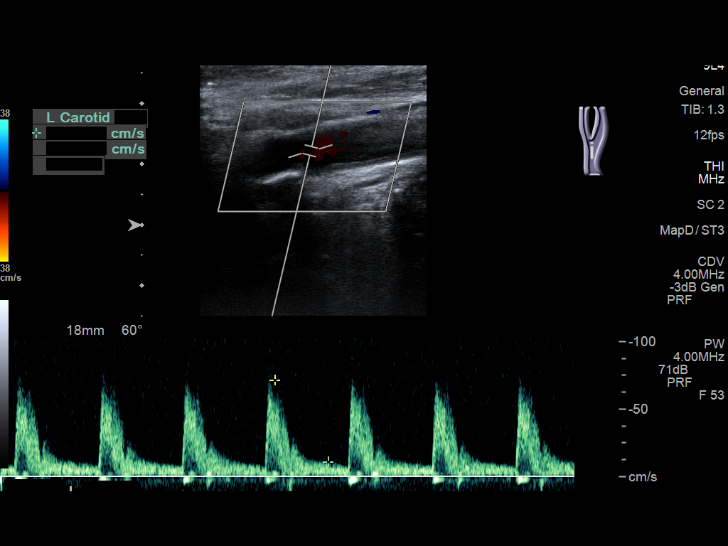
[im 53/65]
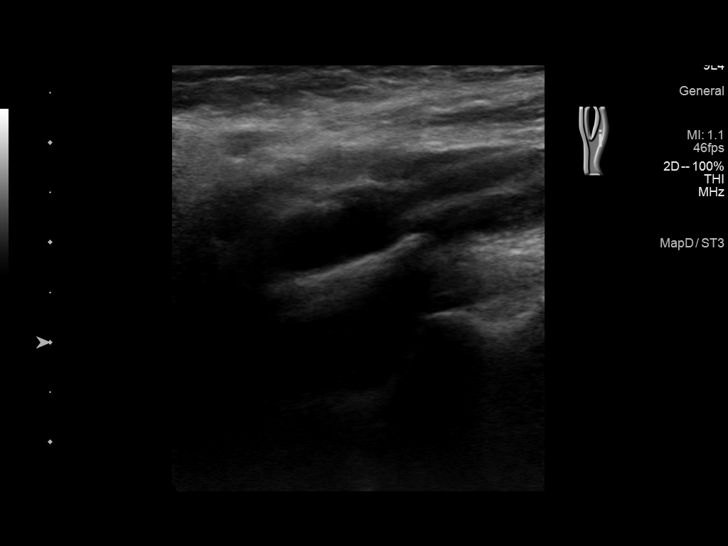
[im 59/65]
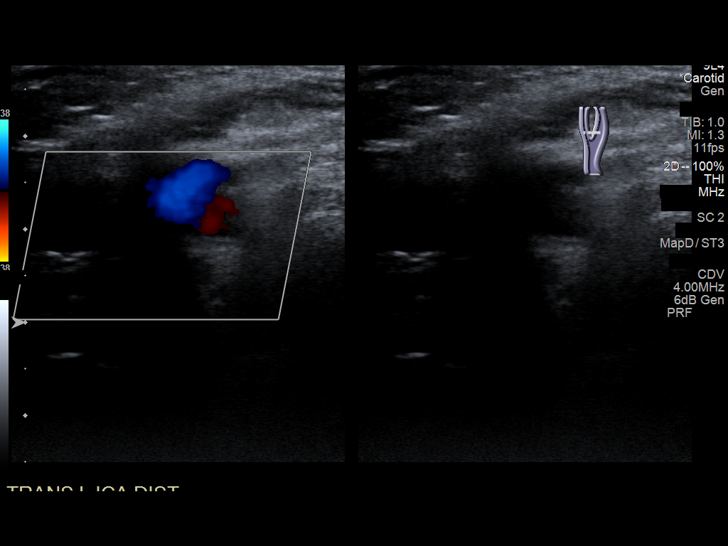
[im 65/65]
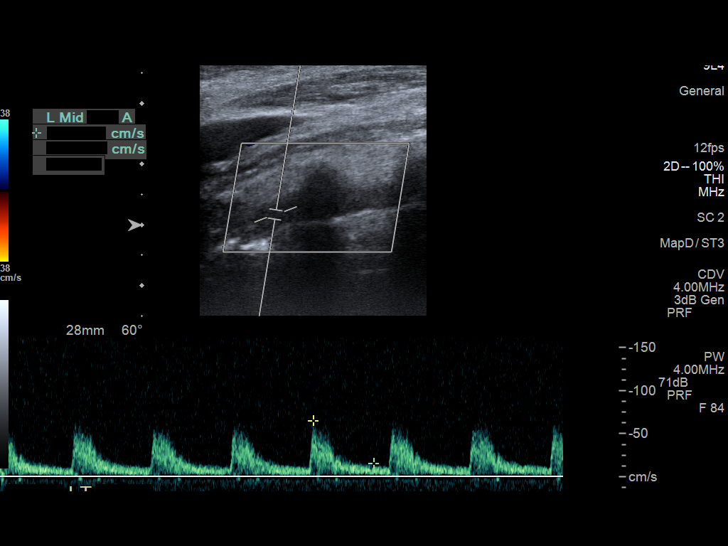

[13 of 24 positions shown; findings below may reference images not displayed]

FINDINGS: Criteria: Quantification of carotid stenosis is based on velocity
parameters that correlate the residual internal carotid diameter
with NASCET-based stenosis levels, using the diameter of the distal
internal carotid lumen as the denominator for stenosis measurement.

The following velocity measurements were obtained:

RIGHT

ICA:  Systolic 62 cm/sec, Diastolic 15 cm/sec

CCA:  74 cm/sec

SYSTOLIC ICA/CCA RATIO:

ECA:  101 cm/sec

LEFT

ICA:  Systolic 142 cm/sec, Diastolic 32 cm/sec

CCA:  79 cm/sec

SYSTOLIC ICA/CCA RATIO:

ECA:  146 cm/sec

Right Brachial SBP: Not acquired

Left Brachial SBP: Not acquired

RIGHT CAROTID ARTERY: No significant calcifications of the right
common carotid artery. Intermediate waveform maintained.
Heterogeneous and partially calcified plaque at the right carotid
bifurcation. No significant lumen shadowing. Low resistance waveform
of the right ICA. No significant tortuosity.

RIGHT VERTEBRAL ARTERY: Antegrade flow with low resistance waveform.

LEFT CAROTID ARTERY: No significant calcifications of the left
common carotid artery. Intermediate waveform maintained.
Heterogeneous and partially calcified plaque at the left carotid
bifurcation without significant lumen shadowing. Low resistance
waveform of the left ICA. No significant tortuosity.

LEFT VERTEBRAL ARTERY:  Antegrade flow with low resistance waveform.
IMPRESSION: Right:

Color duplex indicates minimal heterogeneous and calcified plaque,
with no hemodynamically significant stenosis by duplex criteria in
the extracranial cerebrovascular circulation.

Left:

Heterogeneous and partially calcified plaque at the left carotid
bifurcation, with discordant results regarding degree of stenosis by
established duplex criteria. Peak velocity suggests 50%-69%
stenosis, with the ICA/ CCA ratio suggesting a lesser degree of
stenosis. If establishing a more accurate degree of stenosis is
required, cerebral angiogram should be considered, or as a second
best test, CTA.

## 2021-01-17 ENCOUNTER — Emergency Department: Payer: Medicare (Managed Care)

## 2021-01-17 ENCOUNTER — Inpatient Hospital Stay
Admission: EM | Admit: 2021-01-17 | Discharge: 2021-01-26 | DRG: 480 | Disposition: A | Discharge status: Hospice | Payer: Medicare (Managed Care) | Attending: Internal Medicine | Admitting: Internal Medicine

## 2021-01-17 ENCOUNTER — Other Ambulatory Visit: Payer: Self-pay

## 2021-01-17 DIAGNOSIS — I63211 Cerebral infarction due to unspecified occlusion or stenosis of right vertebral arteries: Secondary | ICD-10-CM | POA: Diagnosis not present

## 2021-01-17 DIAGNOSIS — M9711XA Periprosthetic fracture around internal prosthetic right knee joint, initial encounter: Secondary | ICD-10-CM | POA: Diagnosis present

## 2021-01-17 DIAGNOSIS — Z79899 Other long term (current) drug therapy: Secondary | ICD-10-CM | POA: Diagnosis not present

## 2021-01-17 DIAGNOSIS — G9341 Metabolic encephalopathy: Secondary | ICD-10-CM | POA: Diagnosis present

## 2021-01-17 DIAGNOSIS — E876 Hypokalemia: Secondary | ICD-10-CM | POA: Diagnosis not present

## 2021-01-17 DIAGNOSIS — M9701XA Periprosthetic fracture around internal prosthetic right hip joint, initial encounter: Secondary | ICD-10-CM | POA: Diagnosis present

## 2021-01-17 DIAGNOSIS — G629 Polyneuropathy, unspecified: Secondary | ICD-10-CM | POA: Diagnosis present

## 2021-01-17 DIAGNOSIS — Z515 Encounter for palliative care: Secondary | ICD-10-CM

## 2021-01-17 DIAGNOSIS — Z66 Do not resuscitate: Secondary | ICD-10-CM | POA: Diagnosis not present

## 2021-01-17 DIAGNOSIS — F341 Dysthymic disorder: Secondary | ICD-10-CM | POA: Diagnosis not present

## 2021-01-17 DIAGNOSIS — J690 Pneumonitis due to inhalation of food and vomit: Secondary | ICD-10-CM | POA: Diagnosis not present

## 2021-01-17 DIAGNOSIS — R29733 NIHSS score 33: Secondary | ICD-10-CM | POA: Diagnosis not present

## 2021-01-17 DIAGNOSIS — A419 Sepsis, unspecified organism: Secondary | ICD-10-CM | POA: Diagnosis not present

## 2021-01-17 DIAGNOSIS — E782 Mixed hyperlipidemia: Secondary | ICD-10-CM

## 2021-01-17 DIAGNOSIS — R296 Repeated falls: Secondary | ICD-10-CM | POA: Diagnosis present

## 2021-01-17 DIAGNOSIS — I10 Essential (primary) hypertension: Secondary | ICD-10-CM | POA: Diagnosis present

## 2021-01-17 DIAGNOSIS — Z96651 Presence of right artificial knee joint: Secondary | ICD-10-CM | POA: Diagnosis present

## 2021-01-17 DIAGNOSIS — R4189 Other symptoms and signs involving cognitive functions and awareness: Secondary | ICD-10-CM | POA: Diagnosis not present

## 2021-01-17 DIAGNOSIS — Z823 Family history of stroke: Secondary | ICD-10-CM

## 2021-01-17 DIAGNOSIS — Z885 Allergy status to narcotic agent status: Secondary | ICD-10-CM

## 2021-01-17 DIAGNOSIS — G9340 Encephalopathy, unspecified: Secondary | ICD-10-CM

## 2021-01-17 DIAGNOSIS — Y92019 Unspecified place in single-family (private) house as the place of occurrence of the external cause: Secondary | ICD-10-CM

## 2021-01-17 DIAGNOSIS — Z8249 Family history of ischemic heart disease and other diseases of the circulatory system: Secondary | ICD-10-CM | POA: Diagnosis not present

## 2021-01-17 DIAGNOSIS — F0390 Unspecified dementia without behavioral disturbance: Secondary | ICD-10-CM | POA: Diagnosis present

## 2021-01-17 DIAGNOSIS — S7291XA Unspecified fracture of right femur, initial encounter for closed fracture: Secondary | ICD-10-CM | POA: Diagnosis present

## 2021-01-17 DIAGNOSIS — F32A Depression, unspecified: Secondary | ICD-10-CM

## 2021-01-17 DIAGNOSIS — R413 Other amnesia: Secondary | ICD-10-CM

## 2021-01-17 DIAGNOSIS — D62 Acute posthemorrhagic anemia: Secondary | ICD-10-CM

## 2021-01-17 DIAGNOSIS — I639 Cerebral infarction, unspecified: Secondary | ICD-10-CM

## 2021-01-17 DIAGNOSIS — W19XXXA Unspecified fall, initial encounter: Secondary | ICD-10-CM | POA: Diagnosis present

## 2021-01-17 DIAGNOSIS — D696 Thrombocytopenia, unspecified: Secondary | ICD-10-CM

## 2021-01-17 DIAGNOSIS — G600 Hereditary motor and sensory neuropathy: Secondary | ICD-10-CM | POA: Diagnosis present

## 2021-01-17 DIAGNOSIS — Z8673 Personal history of transient ischemic attack (TIA), and cerebral infarction without residual deficits: Secondary | ICD-10-CM

## 2021-01-17 DIAGNOSIS — S72351A Displaced comminuted fracture of shaft of right femur, initial encounter for closed fracture: Principal | ICD-10-CM | POA: Diagnosis present

## 2021-01-17 DIAGNOSIS — Z7982 Long term (current) use of aspirin: Secondary | ICD-10-CM

## 2021-01-17 DIAGNOSIS — Z9889 Other specified postprocedural states: Secondary | ICD-10-CM

## 2021-01-17 DIAGNOSIS — Z419 Encounter for procedure for purposes other than remedying health state, unspecified: Secondary | ICD-10-CM

## 2021-01-17 DIAGNOSIS — Z20822 Contact with and (suspected) exposure to covid-19: Secondary | ICD-10-CM | POA: Diagnosis present

## 2021-01-17 DIAGNOSIS — Z96641 Presence of right artificial hip joint: Secondary | ICD-10-CM | POA: Diagnosis present

## 2021-01-17 DIAGNOSIS — I6389 Other cerebral infarction: Secondary | ICD-10-CM | POA: Diagnosis not present

## 2021-01-17 DIAGNOSIS — I6322 Cerebral infarction due to unspecified occlusion or stenosis of basilar arteries: Secondary | ICD-10-CM

## 2021-01-17 DIAGNOSIS — S72491A Other fracture of lower end of right femur, initial encounter for closed fracture: Secondary | ICD-10-CM

## 2021-01-17 DIAGNOSIS — Z8781 Personal history of (healed) traumatic fracture: Secondary | ICD-10-CM

## 2021-01-17 DIAGNOSIS — K219 Gastro-esophageal reflux disease without esophagitis: Secondary | ICD-10-CM

## 2021-01-17 DIAGNOSIS — Z791 Long term (current) use of non-steroidal anti-inflammatories (NSAID): Secondary | ICD-10-CM | POA: Diagnosis not present

## 2021-01-17 DIAGNOSIS — Z888 Allergy status to other drugs, medicaments and biological substances status: Secondary | ICD-10-CM

## 2021-01-17 DIAGNOSIS — S72491D Other fracture of lower end of right femur, subsequent encounter for closed fracture with routine healing: Secondary | ICD-10-CM | POA: Diagnosis not present

## 2021-01-17 DIAGNOSIS — Z86011 Personal history of benign neoplasm of the brain: Secondary | ICD-10-CM

## 2021-01-17 LAB — CBC WITH DIFFERENTIAL/PLATELET
Abs Immature Granulocytes: 0.03 10*3/uL (ref 0.00–0.07)
Basophils Absolute: 0 10*3/uL (ref 0.0–0.1)
Basophils Relative: 0 %
Eosinophils Absolute: 0.1 10*3/uL (ref 0.0–0.5)
Eosinophils Relative: 1 %
HCT: 39 % (ref 36.0–46.0)
Hemoglobin: 12.4 g/dL (ref 12.0–15.0)
Immature Granulocytes: 0 %
Lymphocytes Relative: 28 %
Lymphs Abs: 2 10*3/uL (ref 0.7–4.0)
MCH: 31 pg (ref 26.0–34.0)
MCHC: 31.8 g/dL (ref 30.0–36.0)
MCV: 97.5 fL (ref 80.0–100.0)
Monocytes Absolute: 0.5 10*3/uL (ref 0.1–1.0)
Monocytes Relative: 6 %
Neutro Abs: 4.4 10*3/uL (ref 1.7–7.7)
Neutrophils Relative %: 65 %
Platelets: 122 10*3/uL — ABNORMAL LOW (ref 150–400)
RBC: 4 MIL/uL (ref 3.87–5.11)
RDW: 14.2 % (ref 11.5–15.5)
WBC: 7 10*3/uL (ref 4.0–10.5)
nRBC: 0 % (ref 0.0–0.2)

## 2021-01-17 LAB — BASIC METABOLIC PANEL
Anion gap: 9 (ref 5–15)
BUN: 21 mg/dL (ref 8–23)
CO2: 24 mmol/L (ref 22–32)
Calcium: 9 mg/dL (ref 8.9–10.3)
Chloride: 109 mmol/L (ref 98–111)
Creatinine, Ser: 0.67 mg/dL (ref 0.44–1.00)
GFR, Estimated: >60 mL/min (ref >60)
Glucose, Bld: 127 mg/dL — ABNORMAL HIGH (ref 70–99)
Potassium: 3.3 mmol/L — ABNORMAL LOW (ref 3.5–5.1)
Sodium: 142 mmol/L (ref 135–145)

## 2021-01-17 LAB — APTT: aPTT: 30 seconds (ref 24–36)

## 2021-01-17 LAB — PROTIME-INR
INR: 1 (ref 0.8–1.2)
Prothrombin Time: 13.4 seconds (ref 11.4–15.2)

## 2021-01-17 LAB — RESP PANEL BY RT-PCR (FLU A&B, COVID) ARPGX2
Influenza A by PCR: NEGATIVE
Influenza B by PCR: NEGATIVE
SARS Coronavirus 2 by RT PCR: NEGATIVE

## 2021-01-17 LAB — MAGNESIUM: Magnesium: 2 mg/dL (ref 1.7–2.4)

## 2021-01-17 MED ORDER — GABAPENTIN 300 MG PO CAPS
600.0000 mg | ORAL_CAPSULE | Freq: Every day | ORAL | Status: DC
  Administered 2021-01-17 – 2021-01-19 (×2): 600 mg via ORAL
  Filled 2021-01-17 (×2): qty 2

## 2021-01-17 MED ORDER — HYDROCODONE-ACETAMINOPHEN 5-325 MG PO TABS
1.0000 | ORAL_TABLET | Freq: Four times a day (QID) | ORAL | Status: DC | PRN
  Administered 2021-01-17 – 2021-01-19 (×4): 2 via ORAL
  Filled 2021-01-17 (×3): qty 2
  Filled 2021-01-17: qty 1
  Filled 2021-01-17: qty 2

## 2021-01-17 MED ORDER — FENTANYL CITRATE (PF) 100 MCG/2ML IJ SOLN
50.0000 ug | Freq: Once | INTRAMUSCULAR | Status: AC
  Administered 2021-01-17: 50 ug via INTRAVENOUS
  Filled 2021-01-17: qty 2

## 2021-01-17 MED ORDER — VITAMIN B-12 1000 MCG PO TABS
1000.0000 ug | ORAL_TABLET | Freq: Every day | ORAL | Status: DC
  Administered 2021-01-17 – 2021-01-19 (×2): 1000 ug via ORAL
  Filled 2021-01-17 (×3): qty 1

## 2021-01-17 MED ORDER — PANTOPRAZOLE SODIUM 40 MG PO TBEC
40.0000 mg | DELAYED_RELEASE_TABLET | Freq: Every morning | ORAL | Status: DC
  Administered 2021-01-19: 40 mg via ORAL
  Filled 2021-01-17: qty 1

## 2021-01-17 MED ORDER — HYDRALAZINE HCL 25 MG PO TABS: 25.0000 mg | ORAL_TABLET | Freq: Three times a day (TID) | ORAL | Status: AC | PRN

## 2021-01-17 MED ORDER — MELATONIN 5 MG PO TABS
2.5000 mg | ORAL_TABLET | Freq: Every day | ORAL | Status: DC
  Administered 2021-01-17 – 2021-01-18 (×2): 2.5 mg via ORAL
  Filled 2021-01-17 (×2): qty 1
  Filled 2021-01-17: qty 0.5

## 2021-01-17 MED ORDER — SODIUM CHLORIDE 0.9 % IV SOLN
1.0000 g | Freq: Once | INTRAVENOUS | Status: AC
  Administered 2021-01-18: 1 g via INTRAVENOUS

## 2021-01-17 MED ORDER — IBUPROFEN 400 MG PO TABS
400.0000 mg | ORAL_TABLET | Freq: Four times a day (QID) | ORAL | Status: AC | PRN
  Administered 2021-01-18: 400 mg via ORAL
  Filled 2021-01-17: qty 1

## 2021-01-17 MED ORDER — POTASSIUM CITRATE-CITRIC ACID 1100-334 MG/5ML PO SOLN
30.0000 meq | Freq: Once | ORAL | Status: AC
  Administered 2021-01-17: 30 meq via ORAL
  Filled 2021-01-17 (×2): qty 15

## 2021-01-17 MED ORDER — ATORVASTATIN CALCIUM 20 MG PO TABS
40.0000 mg | ORAL_TABLET | Freq: Every day | ORAL | Status: DC
  Administered 2021-01-17 – 2021-01-18 (×2): 40 mg via ORAL
  Filled 2021-01-17 (×3): qty 2

## 2021-01-17 MED ORDER — CITALOPRAM HYDROBROMIDE 20 MG PO TABS
10.0000 mg | ORAL_TABLET | Freq: Every day | ORAL | Status: DC
  Administered 2021-01-19: 10 mg via ORAL
  Filled 2021-01-17 (×3): qty 1

## 2021-01-17 MED ORDER — DONEPEZIL HCL 5 MG PO TABS
10.0000 mg | ORAL_TABLET | Freq: Every evening | ORAL | Status: DC
  Administered 2021-01-17 – 2021-01-18 (×2): 10 mg via ORAL
  Filled 2021-01-17 (×4): qty 2

## 2021-01-17 MED ORDER — TRAZODONE HCL 50 MG PO TABS: 50.0000 mg | ORAL_TABLET | Freq: Every evening | ORAL | Status: DC | PRN

## 2021-01-17 MED ORDER — OXYBUTYNIN CHLORIDE 5 MG PO TABS
5.0000 mg | ORAL_TABLET | Freq: Two times a day (BID) | ORAL | Status: DC
  Administered 2021-01-17 – 2021-01-19 (×3): 5 mg via ORAL
  Filled 2021-01-17 (×8): qty 1

## 2021-01-17 MED ORDER — MONTELUKAST SODIUM 10 MG PO TABS
10.0000 mg | ORAL_TABLET | Freq: Every day | ORAL | Status: DC
  Administered 2021-01-17 – 2021-01-18 (×2): 10 mg via ORAL
  Filled 2021-01-17 (×3): qty 1

## 2021-01-17 MED ORDER — PANTOPRAZOLE SODIUM 40 MG PO TBEC: 40.0000 mg | DELAYED_RELEASE_TABLET | Freq: Every morning | ORAL | Status: DC

## 2021-01-17 MED ORDER — LISINOPRIL 5 MG PO TABS
7.5000 mg | ORAL_TABLET | Freq: Every day | ORAL | Status: DC
  Administered 2021-01-17 – 2021-01-18 (×2): 7.5 mg via ORAL
  Filled 2021-01-17 (×2): qty 2
  Filled 2021-01-17 (×2): qty 1

## 2021-01-17 MED ORDER — MELATONIN 3 MG PO TABS
3.0000 mg | ORAL_TABLET | Freq: Every evening | ORAL | Status: DC | PRN
  Filled 2021-01-17: qty 1

## 2021-01-17 MED ORDER — ALBUTEROL SULFATE HFA 108 (90 BASE) MCG/ACT IN AERS
2.0000 | INHALATION_SPRAY | RESPIRATORY_TRACT | Status: DC | PRN
  Filled 2021-01-17: qty 6.7

## 2021-01-17 MED ORDER — MORPHINE SULFATE (PF) 2 MG/ML IV SOLN
0.5000 mg | INTRAVENOUS | Status: DC | PRN
  Administered 2021-01-17 – 2021-01-18 (×3): 0.5 mg via INTRAVENOUS
  Filled 2021-01-17 (×3): qty 1

## 2021-01-17 NOTE — ED Provider Notes (Signed)
[facility] Emergency Department Provider Note  ____________________________________________   Event Date/Time   First MD Initiated Contact with Patient 01/17/21 1101     (approximate)  I have reviewed the triage vital signs and the nursing notes.   HISTORY  Chief Complaint Fall    HPI Crystal Burch is a 85 y.o. female w/ hypertension who comes in for a fall.  Patient reports having a fall today.  States that she was carrying laundry and she is not exactly sure how she fell but fell down onto her right leg.  Patient reporting pain in that right leg better with the fentanyl given by EMS, worse with movement, constant since the fall.  Patient states that she has been nonambulatory since the fall and after she fell she immediately called her husband who then called EMS.  Patient is adamant that she did not hit her head did not lose consciousness.  States that she has no other pain in her neck, chest or other extremities.      Past Medical History:  Diagnosis Date  . Dementia ([place])   . Hypertension     Patient Active Problem List   Diagnosis Date Noted  . Syncope 04/13/2019    Past Surgical History:  Procedure Laterality Date  . ABDOMINAL HYSTERECTOMY      Prior to Admission medications   Medication Sig Start Date End Date Taking? Authorizing Provider  aspirin EC 81 MG tablet Take 81 mg by mouth daily.    [provider]  atorvastatin (LIPITOR) 40 MG tablet Take 40 mg by mouth daily.     [provider]  citalopram (CELEXA) 10 MG tablet Take 10 mg by mouth daily. 10/08/14   [provider]  donepezil (ARICEPT) 10 MG tablet Take 10 mg by mouth every evening.     [provider]  gabapentin (NEURONTIN) 300 MG capsule Take 600 mg by mouth daily. 02/03/14   [provider]  lisinopril (ZESTRIL) 5 MG tablet Take 7.5 mg by mouth at bedtime. 09/21/18   [provider]  meloxicam (MOBIC) 7.5 MG tablet Take  7.5 mg by mouth daily. 12/21/16   [provider]  omeprazole (PRILOSEC) 20 MG capsule Take 20 mg by mouth daily.    [provider]  oxybutynin (DITROPAN) 5 MG tablet Take 5 mg by mouth 2 (two) times daily. 01/20/14   [provider]  vitamin B-12 (CYANOCOBALAMIN) 1000 MCG tablet Take 1,000 mcg by mouth daily.    [provider]    Allergies Patient has no known allergies.  Family History  Problem Relation Age of Onset  . Stroke Mother   . Hypertension Mother   . Stroke Father   . Hypertension Father   . Stroke Brother   . Hypertension Brother     Social History Social History   Tobacco Use  . Smoking status: Never Smoker  . Smokeless tobacco: Never Used  Substance Use Topics  . Alcohol use: Not Currently      Review of Systems Constitutional: No fever/chills Eyes: No visual changes. ENT: No sore throat. Cardiovascular: Denies chest pain. Respiratory: Denies shortness of breath. Gastrointestinal: No abdominal pain.  No nausea, no vomiting.  No diarrhea.  No constipation. Genitourinary: Negative for dysuria. Musculoskeletal: Right leg pain Skin: Negative for rash. Neurological: Negative for headaches, focal weakness or numbness. All other ROS negative ____________________________________________   PHYSICAL EXAM:  VITAL SIGNS: ED Triage Vitals  Enc Vitals Group  BP 01/17/21 1106 (!) 173/72     Pulse Rate 01/17/21 1108 63     Resp --      Temp --      Temp src --      SpO2 01/17/21 1108 90 %     Weight --      Height --      Head Circumference --      Peak Flow --      Pain Score --      Pain Loc --      Pain Edu? --      Excl. in [place]? --     Constitutional: Alert and oriented. Well appearing and in no acute distress. Eyes: Conjunctivae are normal. EOMI. Head: Atraumatic. Nose: No congestion/rhinnorhea. Mouth/Throat: Mucous membranes are moist.   Neck: No stridor. Trachea Midline. FROM Cardiovascular: Normal  rate, regular rhythm. Grossly normal heart sounds.  Good peripheral circulation. Respiratory: Normal respiratory effort.  No retractions. Lungs CTAB. Gastrointestinal: Soft and nontender. No distention. No abdominal bruits.  Musculoskeletal: Swelling noted to the right upper thigh.  Pain in this area.  Does not only seem to be tender in her right hip though.  Some pain in her right knee.  Old surgical scars.  2+ distal pulses. Neurologic:  Normal speech and language. No gross focal neurologic deficits are appreciated.  Skin:  Skin is warm, dry and intact. No rash noted. Psychiatric: Mood and affect are normal. Speech and behavior are normal. GU: Deferred   ____________________________________________   LABS (all labs ordered are listed, but only abnormal results are displayed)  Labs Reviewed  CBC WITH DIFFERENTIAL/PLATELET  BASIC METABOLIC PANEL  PROTIME-INR  APTT  TYPE AND SCREEN   ____________________________________________   ED ECG REPORT I, Vanessa [place], the attending physician, personally viewed and interpreted this ECG.  Sinus rate of 62, no ST elevation, no T wave versions but does have a PVC, normal intervals otherwise ____________________________________________  RADIOLOGY Robert Bellow, personally viewed and evaluated these images (plain radiographs) as part of my medical decision making, as well as reviewing the written report by the radiologist.  ED MD interpretation: Femur fracture noted  Official radiology report(s): DG Chest 1 View  Result Date: 01/17/2021 CLINICAL DATA:  Fall EXAM: CHEST  1 VIEW COMPARISON:  04/13/2019 FINDINGS: Cardiomegaly. Both lungs are clear. The visualized skeletal structures are unremarkable. IMPRESSION: Cardiomegaly without acute abnormality of the lungs in AP portable projection. Electronically Signed   By: Eddie Candle M.D.   On: 01/17/2021 12:43   DG Pelvis 1-2 Views  Result Date: 01/17/2021 CLINICAL DATA:  85 year old who fell  while at home and complains of RIGHT leg pain. Initial encounter. EXAM: PELVIS - 1-2 VIEW COMPARISON:  Bone window images from CT pelvis 10/02/2008. AP pelvis x-ray 04/15/2007. FINDINGS: Prior RIGHT total hip arthroplasty with anatomic alignment. Prior ORIF of a LEFT femoral neck fracture with normal healing. Osseous demineralization. No evidence of acute fracture. Sacroiliac joints anatomically aligned without diastasis, demonstrating degenerative changes. Symphysis pubis anatomically aligned without diastasis. IMPRESSION: No acute osseous abnormality.  Osseous demineralization. Electronically Signed   By: Evangeline Dakin M.D.   On: 01/17/2021 12:45   DG Knee 1-2 Views Right  Result Date: 01/17/2021 CLINICAL DATA:  Knee pain after fall EXAM: RIGHT KNEE - 1-2 VIEW COMPARISON:  None. FINDINGS: Previous right knee arthroplasty. There is a obliquely oriented periprosthetic fracture deformity involving the mid and distal diaphysis of the right femur. There is medial displacement of  the distal fracture fragments with posterior angulation. IMPRESSION: Acute periprosthetic fracture involves the mid and distal diaphysis of the right femur. Medial displacement and posterior angulation of the distal fracture fragments. Electronically Signed   By: Kerby Moors M.D.   On: 01/17/2021 12:40   DG Femur Min 2 Views Right  Result Date: 01/17/2021 CLINICAL DATA:  Fall. EXAM: RIGHT FEMUR 2 VIEWS COMPARISON:  04/15/2007 FINDINGS: Previous right hip arthroplasty and right knee arthroplasty. Acute, obliquely oriented periprosthetic fracture is noted involving the mid and distal shaft of the right femur. There is medial displacement of the distal fracture fragments with posterior angulation. IMPRESSION: Acute, obliquely oriented periprosthetic fracture involves the mid and distal shaft of the right femur. Electronically Signed   By: Kerby Moors M.D.   On: 01/17/2021 12:41     ____________________________________________   PROCEDURES  Procedure(s) performed (including Critical Care):  .1-3 Lead EKG Interpretation Performed by: Vanessa [place], MD Authorized by: Vanessa [place], MD     Interpretation: normal     ECG rate:  70s   ECG rate assessment: normal     Rhythm: sinus rhythm     Ectopy: PVCs     Conduction: normal       ____________________________________________   INITIAL IMPRESSION / ASSESSMENT AND PLAN / ED COURSE  Jaclynn Guarneri was evaluated in Emergency Department on 01/17/2021 for the symptoms described in the history of present illness. She was evaluated in the context of the global COVID-19 pandemic, which necessitated consideration that the patient might be at risk for infection with the SARS-CoV-2 virus that causes COVID-19. Institutional protocols and algorithms that pertain to the evaluation of patients at risk for COVID-19 are in a state of rapid change based on information released by regulatory bodies including the CDC and federal and state organizations. These policies and algorithms were followed during the patient's care in the ED.    Patient is well-appearing pleasant 85 year old who comes in for fall.  Most likely mechanical but will get labs and EKG to evaluate for Electra MIs, AKI, arrhythmia.  We will keep on the cardiac monitor.  Will get x-rays to evaluate for fracture of her femur versus knee.  At this time as well of swelling will need to keep close eye on blood levels.  Patient is vascularly intact at this time.  Patient EKG with PVC  X-ray confirmed femur fracture.  Patient was given additional dose of fentanyl and desatted therefore placed on oxygen.  Discussed with Dr. Rudene Christians from orthopedics who they will be able to operate here.  Discussed with family and they are okay staying.  Will discuss possible team for admission         ____________________________________________   FINAL CLINICAL IMPRESSION(S) / ED  DIAGNOSES   Final diagnoses:  Closed displaced comminuted fracture of shaft of right femur, initial encounter ([place])      MEDICATIONS GIVEN DURING THIS VISIT:  Medications  atorvastatin (LIPITOR) tablet 40 mg (has no administration in time range)  lisinopril (ZESTRIL) tablet 7.5 mg (has no administration in time range)  citalopram (CELEXA) tablet 10 mg (has no administration in time range)  donepezil (ARICEPT) tablet 10 mg (has no administration in time range)  pantoprazole (PROTONIX) EC tablet 40 mg (has no administration in time range)  oxybutynin (DITROPAN) tablet 5 mg (has no administration in time range)  vitamin B-12 (CYANOCOBALAMIN) tablet 1,000 mcg (has no administration in time range)  gabapentin (NEURONTIN) capsule 600 mg (has no administration in  time range)  HYDROcodone-acetaminophen (NORCO/VICODIN) 5-325 MG per tablet 1-2 tablet (has no administration in time range)  morphine 2 MG/ML injection 0.5 mg (has no administration in time range)  melatonin tablet 3 mg (has no administration in time range)  citric acid-potassium citrate (POLYCITRA) 10 mEq/5 ml solution 30 mEq (has no administration in time range)  fentaNYL (SUBLIMAZE) injection 50 mcg (50 mcg Intravenous Given 01/17/21 1240)     ED Discharge Orders    None       Note:  This document was prepared using Dragon voice recognition software and may include unintentional dictation errors.   Vanessa Slaton, MD 01/17/21 1320

## 2021-01-17 NOTE — H&P (Signed)
History and Physical   Crystal Burch YTK:[id number] DOB: 02-06-1936 DOA: 01/17/2021  PCP: [facility]  Outpatient Specialists: Dr. Kandis Cocking, [facility] cardiology at [facility] and Dr. Amada Kingfisher, neurology for meningiomas Patient coming from: Home  I have personally briefly reviewed patient's old medical records in [place].  Chief Concern: Right leg pain  HPI: Crystal Burch is a 85 y.o. female with medical history significant for hypertension, hyperlipidemia, neuropathy, depression/anxiety, dementia/memory loss without behavioral disturbances, Charcot Mary tooth disease, status post total knee replacement right in 03/25/2015, status post total hip replacement on the right in 04/30/2012 with conversion in 2017, large hiatal hernia, history of lacunar infarction, history of meningioma of the brain, presents to the emergency department for right leg pain after a fall.  She reports that she was sitting on the edge of the bed and about to put on her cloths when she slipped off the bed and fell to the floor. She states that she fell flat, and denies LOC and head trauma. She states she landed on her bottom.  She reports that she has right thigh pain after the pain medication.  She reports that the pain did improve.  She does endorse radiation down her leg and denies numbness around her gluteal area.    At bedside, she was able to tell me her age, full name, current president of the [place] and son at beside.  She knows she is in the hospital.  Social history: She lives with husband of 45 years, Xitlali Kastens. She denies tobacco, etoh, recreational drug use. She formerly was a homemaker and took care of her children and two other children at home.   Vaccinations: she is vaccinated, two Pfizer covid 19 vaccines, no booster   ROS: Constitutional: no weight change, no fever ENT/Mouth: no sore throat, no rhinorrhea Eyes: no eye pain, no vision  changes Cardiovascular: no chest pain, no dyspnea,  no edema, no palpitations Respiratory: no cough, no sputum, no wheezing Gastrointestinal: no nausea, no vomiting, no diarrhea, no constipation Genitourinary: no urinary incontinence, no dysuria, no hematuria Musculoskeletal: no arthralgias, + right leg pain Skin: no skin lesions, no pruritus, Neuro: + weakness, no loss of consciousness, no syncope Psych: no anxiety, no depression, no decrease appetite Heme/Lymph: no bruising, no bleeding  ED Course: Discussed with ED provider, patient requiring hospitalization for acute right femur fracture.  Vitals in the emergency department was remarkable for temperature 98 Fahrenheit, respiration rate of 17, heart rate 70, blood pressure 105/93, SPO2 of 100% on room air.  Patient was given fentanyl 50 mcg at 12:40 PM per EDP.  Assessment/Plan  Principal Problem:   Femur fracture, right (HCC) Active Problems:   Essential hypertension   Hyperlipidemia, mixed   Depression   Memory loss   GERD (gastroesophageal reflux disease)   Hypokalemia   # Right acute periprosthetic fracture involving the mid and distal diaphysis of the femur-present on admission, suspect secondary to mechanical/slipping off the floor - Orthopedic surgeon has been consulted, Dr. Rudene Christians states he has plans to operate tomorrow, 01/18/2021 - N.p.o. after midnight - Status post fentanyl 50 mcg per EDP - Pain control: Ibuprofen 400 mg p.o. every 6 hours as needed for mild pain, hydrocodone-acetaminophen 1-2 tablet p.o. every 6 hours as needed for moderate pain, morphine 0.5 mg IV every 2 hours as needed for severe pain -Transition of care team consulted for SNF placement - Patient will need PT and OT post orthopedic intervention  #  Hypertension- elevated, multifactorial including pain and patient has not taken her antihypertensive medication today, she takes them at night - Lisinopril 7.5 mg p.o. nightly resumed - Hydralazine 25  mg p.o. every 8 hours as needed for SBP greater than 180  # Hypokalemia-checking mag level - Polycitra 30 mEq once - BMP in the a.m.  # Hyperlipidemia-atorvastatin 40 mg nightly resumed  # Anxiety/depression-resume citalopram 10 mg daily  # Memory loss-resume donepezil 10 mg nightly - Follow-up outpatient with neurologist, [facility] neurology clinic, Dr. Iris Pert  # GERD- pantoprazole 40 mg q. morning, starting 01/19/2021  # Peripheral neuropathy-resumed gabapentin 600 mg daily  # History of meningiomas-follow-up outpatient with neurology  # COVID 19 PCR testing has been collected and pending  # DVT prophylaxis- SCDs bilaterally ordered - I have not initiated pharmacologic DVT prophylaxis in anticipation of surgery in the a.m. - A.m. team to initiate pharmacologic DVT prophylaxis when appropriate  Chart reviewed.   02/14/2017: Normal left ejection systolic function, ejection fraction 55 to 44%, grade 2 diastolic dysfunction, normal right ventricular systolic function, tricuspid regurgitation is mild, elevated pulmonary artery systolic pressure that is mild, small pericardial effusion  04/14/2019: Ejection fraction greater than 65%, cavity size normal, elevated left ventricular end-diastolic pressure  DVT prophylaxis: SCDs Code Status: Full code Diet: Heart healthy now, n.p.o. at midnight Family Communication: Discussed and updated with son at bedside, Mr. Moro Disposition Plan: Pending clinical course Consults called: Orthopedic surgery Admission status: Inpatient, MedSurg, no telemetry  Past Medical History:  Diagnosis Date  . Dementia ([place])   . Hypertension    Past Surgical History:  Procedure Laterality Date  . ABDOMINAL HYSTERECTOMY     Social History:  reports that she has never smoked. She has never used smokeless tobacco. She reports previous alcohol use. No history on file for drug use.  Allergies  Allergen Reactions  . Quinapril Nausea And Vomiting     UNKNOWN REACTION OR SEVERITY REPORTED BY PATIENT  . Tramadol Hcl     Other reaction(s): ? itching, Pt called to report itching after taking 3 different doses June 2013, denies itching as of October 2013, med restarted   Family History  Problem Relation Age of Onset  . Stroke Mother   . Hypertension Mother   . Stroke Father   . Hypertension Father   . Stroke Brother   . Hypertension Brother    Family history: Family history reviewed and not pertinent  Prior to Admission medications   Medication Sig Start Date End Date Taking? Authorizing Provider  aspirin EC 81 MG tablet Take 81 mg by mouth daily.    [provider]  atorvastatin (LIPITOR) 40 MG tablet Take 40 mg by mouth daily.     [provider]  citalopram (CELEXA) 10 MG tablet Take 10 mg by mouth daily. 10/08/14   [provider]  donepezil (ARICEPT) 10 MG tablet Take 10 mg by mouth every evening.     [provider]  gabapentin (NEURONTIN) 300 MG capsule Take 600 mg by mouth daily. 02/03/14   [provider]  lisinopril (ZESTRIL) 5 MG tablet Take 7.5 mg by mouth at bedtime. 09/21/18   [provider]  meloxicam (MOBIC) 7.5 MG tablet Take 7.5 mg by mouth daily. 12/21/16   [provider]  omeprazole (PRILOSEC) 20 MG capsule Take 20 mg by mouth daily.    [provider]  oxybutynin (DITROPAN) 5 MG tablet Take 5 mg by mouth 2 (two) times daily. 01/20/14  [provider]  vitamin B-12 (CYANOCOBALAMIN) 1000 MCG tablet Take 1,000 mcg by mouth daily.    [provider]   Physical Exam: Vitals:   01/17/21 1121 01/17/21 1235 01/17/21 1245 01/17/21 1247  BP:  (!) 105/93    Pulse:  69 72   Resp:  15 13   Temp:      TempSrc:      SpO2:  97% (!) 83% 93%  Weight: 61 kg     Height: 5' (1.524 m)      Constitutional: appears age-appropriate, NAD, calm, comfortable Eyes: PERRL, lids and conjunctivae normal ENMT: Mucous membranes are moist. Posterior  pharynx clear of any exudate or lesions. Age-appropriate dentition. Hearing appropriate Neck: normal, supple, no masses, no thyromegaly Respiratory: clear to auscultation bilaterally, no wheezing, no crackles. Normal respiratory effort. No accessory muscle use.  Cardiovascular: Regular rate and rhythm, no murmurs / rubs / gallops. No extremity edema. 2+ pedal pulses. No carotid bruits.  Abdomen: no tenderness, no masses palpated, no hepatosplenomegaly. Bowel sounds positive.  Musculoskeletal: no clubbing / cyanosis. No joint deformity bilateral upper and left lower extremities. Good ROM, no contractures, no atrophy of the left lower extremity and bilateral upper extremity. Normal muscle tone.  Right thigh swelling and deformity present Skin: no rashes, lesions, ulcers. No induration Neurologic: Sensation intact. Strength 5/5 in all bilateral upper and left lower.  Decreased strength in the left lower extremity Psychiatric: Normal judgment and insight. Alert and oriented x 3. Normal mood.   EKG: independently reviewed, showing sinus rhythm with rate of 62, QTc 408, occasional PVCs  X-rays on Admission: I personally reviewed and I agree with radiologist reading as below.  DG Chest 1 View  Result Date: 01/17/2021 CLINICAL DATA:  Fall EXAM: CHEST  1 VIEW COMPARISON:  04/13/2019 FINDINGS: Cardiomegaly. Both lungs are clear. The visualized skeletal structures are unremarkable. IMPRESSION: Cardiomegaly without acute abnormality of the lungs in AP portable projection. Electronically Signed   By: Eddie Candle M.D.   On: 01/17/2021 12:43   DG Pelvis 1-2 Views  Result Date: 01/17/2021 CLINICAL DATA:  85 year old who fell while at home and complains of RIGHT leg pain. Initial encounter. EXAM: PELVIS - 1-2 VIEW COMPARISON:  Bone window images from CT pelvis 10/02/2008. AP pelvis x-ray 04/15/2007. FINDINGS: Prior RIGHT total hip arthroplasty with anatomic alignment. Prior ORIF of a LEFT femoral neck fracture  with normal healing. Osseous demineralization. No evidence of acute fracture. Sacroiliac joints anatomically aligned without diastasis, demonstrating degenerative changes. Symphysis pubis anatomically aligned without diastasis. IMPRESSION: No acute osseous abnormality.  Osseous demineralization. Electronically Signed   By: Evangeline Dakin M.D.   On: 01/17/2021 12:45   DG Knee 1-2 Views Right  Result Date: 01/17/2021 CLINICAL DATA:  Knee pain after fall EXAM: RIGHT KNEE - 1-2 VIEW COMPARISON:  None. FINDINGS: Previous right knee arthroplasty. There is a obliquely oriented periprosthetic fracture deformity involving the mid and distal diaphysis of the right femur. There is medial displacement of the distal fracture fragments with posterior angulation. IMPRESSION: Acute periprosthetic fracture involves the mid and distal diaphysis of the right femur. Medial displacement and posterior angulation of the distal fracture fragments. Electronically Signed   By: Kerby Moors M.D.   On: 01/17/2021 12:40   DG Femur Min 2 Views Right  Result Date: 01/17/2021 CLINICAL DATA:  Fall. EXAM: RIGHT FEMUR 2 VIEWS COMPARISON:  04/15/2007 FINDINGS: Previous right hip arthroplasty and right knee arthroplasty. Acute, obliquely oriented periprosthetic fracture is noted involving the mid  and distal shaft of the right femur. There is medial displacement of the distal fracture fragments with posterior angulation. IMPRESSION: Acute, obliquely oriented periprosthetic fracture involves the mid and distal shaft of the right femur. Electronically Signed   By: Kerby Moors M.D.   On: 01/17/2021 12:41   Labs on Admission: I have personally reviewed following labs  CBC: Recent Labs  Lab 01/17/21 1112  WBC 7.0  NEUTROABS 4.4  HGB 12.4  HCT 39.0  MCV 97.5  PLT 123XX123*   Basic Metabolic Panel: Recent Labs  Lab 01/17/21 1112  NA 142  K 3.3*  CL 109  CO2 24  GLUCOSE 127*  BUN 21  CREATININE 0.67  CALCIUM 9.0    GFR: Estimated Creatinine Clearance: 44.3 mL/min (by C-G formula based on SCr of 0.67 mg/dL).  Recent Labs  Lab 01/17/21 1112  INR 1.0   Urine analysis:    Component Value Date/Time   COLORURINE YELLOW (A) 04/13/2019 1026   APPEARANCEUR HAZY (A) 04/13/2019 1026   LABSPEC 1.021 04/13/2019 1026   PHURINE 5.0 04/13/2019 1026   GLUCOSEU NEGATIVE 04/13/2019 1026   HGBUR NEGATIVE 04/13/2019 1026   BILIRUBINUR NEGATIVE 04/13/2019 1026   KETONESUR NEGATIVE 04/13/2019 1026   PROTEINUR 30 (A) 04/13/2019 1026   NITRITE NEGATIVE 04/13/2019 1026   LEUKOCYTESUR NEGATIVE 04/13/2019 1026   Jashanti Clinkscale N Pasco Marchitto D.O. [facility]  If 7PM-7AM, please contact overnight-coverage provider If 7AM-7PM, please contact day coverage provider [email or web address]  01/17/2021, 1:59 PM

## 2021-01-17 NOTE — ED Notes (Signed)
Patient transported to X-ray 

## 2021-01-17 NOTE — ED Notes (Signed)
Patient awaiting transport to floor.

## 2021-01-17 NOTE — ED Triage Notes (Addendum)
Patient presents to ER from home. Patient reports mechanical trip and fall. Denies hitting head or blood thinners. Patient reports pain and swelling to right knee and thigh. Patient A&OX3.  20G LAC 50 mcg Fentanyl

## 2021-01-17 NOTE — Consult Note (Signed)
Reason for Consult: Right femur fracture Referring Physician: Dr. Lyman Bishop Crystal Burch is an 85 y.o. female.  HPI: Patient suffered a fall earlier this morning.  She is confused and is not a good historian.  Checking her prior records she had a prior right total hip at [facility] with prior total knee.  The knee has a longstem and the fracture occurred between the stem of the femoral component of the hip and the stem of the femoral component of the total knee.  Past Medical History:  Diagnosis Date  . Dementia ([place])   . Hypertension     Past Surgical History:  Procedure Laterality Date  . ABDOMINAL HYSTERECTOMY      Family History  Problem Relation Age of Onset  . Stroke Mother   . Hypertension Mother   . Stroke Father   . Hypertension Father   . Stroke Brother   . Hypertension Brother     Social History:  reports that she has never smoked. She has never used smokeless tobacco. She reports previous alcohol use. No history on file for drug use.  Allergies:  Allergies  Allergen Reactions  . Quinapril Nausea And Vomiting    UNKNOWN REACTION OR SEVERITY REPORTED BY PATIENT  . Tramadol Hcl     Other reaction(s): ? itching, Pt called to report itching after taking 3 different doses June 2013, denies itching as of October 2013, med restarted    Medications: I have reviewed the patient's current medications.  Results for orders placed or performed during the hospital encounter of 01/17/21 (from the past 48 hour(s))  CBC with Differential     Status: Abnormal   Collection Time: 01/17/21 11:12 AM  Result Value Ref Range   WBC 7.0 4.0 - 10.5 K/uL   RBC 4.00 3.87 - 5.11 MIL/uL   Hemoglobin 12.4 12.0 - 15.0 g/dL   HCT 39.0 36.0 - 46.0 %   MCV 97.5 80.0 - 100.0 fL   MCH 31.0 26.0 - 34.0 pg   MCHC 31.8 30.0 - 36.0 g/dL   RDW 14.2 11.5 - 15.5 %   Platelets 122 (L) 150 - 400 K/uL   nRBC 0.0 0.0 - 0.2 %   Neutrophils Relative % 65 %   Neutro Abs 4.4 1.7 - 7.7 K/uL   Lymphocytes Relative  28 %   Lymphs Abs 2.0 0.7 - 4.0 K/uL   Monocytes Relative 6 %   Monocytes Absolute 0.5 0.1 - 1.0 K/uL   Eosinophils Relative 1 %   Eosinophils Absolute 0.1 0.0 - 0.5 K/uL   Basophils Relative 0 %   Basophils Absolute 0.0 0.0 - 0.1 K/uL   Immature Granulocytes 0 %   Abs Immature Granulocytes 0.03 0.00 - 0.07 K/uL    Comment: Performed at [facility], [address] [place], [place] [address]  Basic metabolic panel     Status: Abnormal   Collection Time: 01/17/21 11:12 AM  Result Value Ref Range   Sodium 142 135 - 145 mmol/L   Potassium 3.3 (L) 3.5 - 5.1 mmol/L   Chloride 109 98 - 111 mmol/L   CO2 24 22 - 32 mmol/L   Glucose, Bld 127 (H) 70 - 99 mg/dL    Comment: Glucose reference range applies only to samples taken after fasting for at least 8 hours.   BUN 21 8 - 23 mg/dL   Creatinine, Ser 0.67 0.44 - 1.00 mg/dL   Calcium 9.0 8.9 - 10.3 mg/dL   GFR, Estimated >60 >  60 mL/min    Comment: (NOTE) Calculated using the CKD-EPI Creatinine Equation (2021)    Anion gap 9 5 - 15    Comment: Performed at [facility], [address] [place], [place] [address]  Protime-INR     Status: None   Collection Time: 01/17/21 11:12 AM  Result Value Ref Range   Prothrombin Time 13.4 11.4 - 15.2 seconds   INR 1.0 0.8 - 1.2    Comment: (NOTE) INR goal varies based on device and disease states. Performed at [facility], [address] [place], [place] [address]   APTT     Status: None   Collection Time: 01/17/21 11:12 AM  Result Value Ref Range   aPTT 30 24 - 36 seconds    Comment: Performed at [facility], [address] [place], [place] [address]  Type and screen [place]     Status: None   Collection Time: 01/17/21 11:12 AM  Result Value Ref Range   ABO/RH(D) A NEG    Antibody Screen NEG    Sample Expiration      01/20/2021,2359 Performed at [facility], [address] [place], [place] [address]   Resp  Panel by RT-PCR (Flu A&B, Covid) Nasopharyngeal Swab     Status: None   Collection Time: 01/17/21 11:12 AM   Specimen: Nasopharyngeal Swab; Nasopharyngeal(NP) swabs in vial transport medium  Result Value Ref Range   SARS Coronavirus 2 by RT PCR NEGATIVE NEGATIVE    Comment: (NOTE) SARS-CoV-2 target nucleic acids are NOT DETECTED.  The SARS-CoV-2 RNA is generally detectable in upper respiratory specimens during the acute phase of infection. The lowest concentration of SARS-CoV-2 viral copies this assay can detect is 138 copies/mL. A negative result does not preclude SARS-Cov-2 infection and should not be used as the sole basis for treatment or other patient management decisions. A negative result may occur with  improper specimen collection/handling, submission of specimen other than nasopharyngeal swab, presence of viral mutation(s) within the areas targeted by this assay, and inadequate number of viral copies(<138 copies/mL). A negative result must be combined with clinical observations, patient history, and epidemiological information. The expected result is Negative.  Fact Sheet for Patients:  EntrepreneurPulse.com.au  Fact Sheet for Healthcare Providers:  IncredibleEmployment.be  This test is no t yet approved or cleared by the [place] FDA and  has been authorized for detection and/or diagnosis of SARS-CoV-2 by FDA under an Emergency Use Authorization (EUA). This EUA will remain  in effect (meaning this test can be used) for the duration of the COVID-19 declaration under Section 564(b)(1) of the Act, 21 U.S.C.section 360bbb-3(b)(1), unless the authorization is terminated  or revoked sooner.       Influenza A by PCR NEGATIVE NEGATIVE   Influenza B by PCR NEGATIVE NEGATIVE    Comment: (NOTE) The Xpert Xpress SARS-CoV-2/FLU/RSV plus assay is intended as an aid in the diagnosis of influenza from Nasopharyngeal swab specimens  and should not be used as a sole basis for treatment. Nasal washings and aspirates are unacceptable for Xpert Xpress SARS-CoV-2/FLU/RSV testing.  Fact Sheet for Patients: EntrepreneurPulse.com.au  Fact Sheet for Healthcare Providers: IncredibleEmployment.be  This test is not yet approved or cleared by the [place] FDA and has been authorized for detection and/or diagnosis of SARS-CoV-2 by FDA under an Emergency Use Authorization (EUA). This EUA will remain in effect (meaning this test can be used) for the duration of the COVID-19 declaration under Section 564(b)(1)  of the Act, 21 U.S.C. section 360bbb-3(b)(1), unless the authorization is terminated or revoked.  Performed at [facility], [address] [place], [place] [address]   Magnesium     Status: None   Collection Time: 01/17/21 11:12 AM  Result Value Ref Range   Magnesium 2.0 1.7 - 2.4 mg/dL    Comment: Performed at [facility], [address] [place], [place] [address]    DG Chest 1 View  Result Date: 01/17/2021 CLINICAL DATA:  Fall EXAM: CHEST  1 VIEW COMPARISON:  04/13/2019 FINDINGS: Cardiomegaly. Both lungs are clear. The visualized skeletal structures are unremarkable. IMPRESSION: Cardiomegaly without acute abnormality of the lungs in AP portable projection. Electronically Signed   By: Eddie Candle M.D.   On: 01/17/2021 12:43   DG Pelvis 1-2 Views  Result Date: 01/17/2021 CLINICAL DATA:  85 year old who fell while at home and complains of RIGHT leg pain. Initial encounter. EXAM: PELVIS - 1-2 VIEW COMPARISON:  Bone window images from CT pelvis 10/02/2008. AP pelvis x-ray 04/15/2007. FINDINGS: Prior RIGHT total hip arthroplasty with anatomic alignment. Prior ORIF of a LEFT femoral neck fracture with normal healing. Osseous demineralization. No evidence of acute fracture. Sacroiliac joints anatomically aligned without diastasis, demonstrating degenerative  changes. Symphysis pubis anatomically aligned without diastasis. IMPRESSION: No acute osseous abnormality.  Osseous demineralization. Electronically Signed   By: Evangeline Dakin M.D.   On: 01/17/2021 12:45   DG Knee 1-2 Views Right  Result Date: 01/17/2021 CLINICAL DATA:  Knee pain after fall EXAM: RIGHT KNEE - 1-2 VIEW COMPARISON:  None. FINDINGS: Previous right knee arthroplasty. There is a obliquely oriented periprosthetic fracture deformity involving the mid and distal diaphysis of the right femur. There is medial displacement of the distal fracture fragments with posterior angulation. IMPRESSION: Acute periprosthetic fracture involves the mid and distal diaphysis of the right femur. Medial displacement and posterior angulation of the distal fracture fragments. Electronically Signed   By: Kerby Moors M.D.   On: 01/17/2021 12:40   DG Femur Min 2 Views Right  Result Date: 01/17/2021 CLINICAL DATA:  Fall. EXAM: RIGHT FEMUR 2 VIEWS COMPARISON:  04/15/2007 FINDINGS: Previous right hip arthroplasty and right knee arthroplasty. Acute, obliquely oriented periprosthetic fracture is noted involving the mid and distal shaft of the right femur. There is medial displacement of the distal fracture fragments with posterior angulation. IMPRESSION: Acute, obliquely oriented periprosthetic fracture involves the mid and distal shaft of the right femur. Electronically Signed   By: Kerby Moors M.D.   On: 01/17/2021 12:41    Review of Systems Blood pressure (!) 143/59, pulse 66, temperature 98 F (36.7 C), temperature source Oral, resp. rate 16, height 5' (1.524 m), weight 61 kg, SpO2 94 %. Physical Exam She has swelling to the thigh without ecchymosis.  She is able flex extend the toes.  She has prior anterior knee incision from her total knee and posterior approach to the hip Assessment/Plan: Periprosthetic fracture of the femur around a total hip and total knee with stemmed tibial component Recommendation  is for ORIF with plate and screw and wire Construct to allow for immediate mobilization.  Discussed with patient's son who is her POA  Hessie Knows 01/17/2021, 4:10 PM

## 2021-01-18 ENCOUNTER — Encounter: Payer: Self-pay | Admitting: Internal Medicine

## 2021-01-18 ENCOUNTER — Inpatient Hospital Stay: Payer: Medicare (Managed Care)

## 2021-01-18 ENCOUNTER — Inpatient Hospital Stay: Payer: Medicare (Managed Care) | Admitting: Anesthesiology

## 2021-01-18 ENCOUNTER — Encounter
Admission: EM | Disposition: A | Discharge status: Hospice | Payer: Self-pay | Source: Home / Self Care | Attending: Internal Medicine

## 2021-01-18 DIAGNOSIS — S72491D Other fracture of lower end of right femur, subsequent encounter for closed fracture with routine healing: Secondary | ICD-10-CM

## 2021-01-18 DIAGNOSIS — I10 Essential (primary) hypertension: Secondary | ICD-10-CM | POA: Diagnosis not present

## 2021-01-18 HISTORY — PX: ORIF PERIPROSTHETIC FRACTURE: SHX5034

## 2021-01-18 LAB — CBC
HCT: 30.6 % — ABNORMAL LOW (ref 36.0–46.0)
Hemoglobin: 10 g/dL — ABNORMAL LOW (ref 12.0–15.0)
MCH: 31.4 pg (ref 26.0–34.0)
MCHC: 32.7 g/dL (ref 30.0–36.0)
MCV: 96.2 fL (ref 80.0–100.0)
Platelets: 126 10*3/uL — ABNORMAL LOW (ref 150–400)
RBC: 3.18 MIL/uL — ABNORMAL LOW (ref 3.87–5.11)
RDW: 13.9 % (ref 11.5–15.5)
WBC: 6.9 10*3/uL (ref 4.0–10.5)
nRBC: 0 % (ref 0.0–0.2)

## 2021-01-18 LAB — BASIC METABOLIC PANEL
Anion gap: 5 (ref 5–15)
BUN: 21 mg/dL (ref 8–23)
CO2: 25 mmol/L (ref 22–32)
Calcium: 8.8 mg/dL — ABNORMAL LOW (ref 8.9–10.3)
Chloride: 109 mmol/L (ref 98–111)
Creatinine, Ser: 0.65 mg/dL (ref 0.44–1.00)
GFR, Estimated: >60 mL/min (ref >60)
Glucose, Bld: 124 mg/dL — ABNORMAL HIGH (ref 70–99)
Potassium: 4 mmol/L (ref 3.5–5.1)
Sodium: 139 mmol/L (ref 135–145)

## 2021-01-18 SURGERY — OPEN REDUCTION INTERNAL FIXATION (ORIF) PERIPROSTHETIC FRACTURE
Anesthesia: General | Laterality: Right

## 2021-01-18 MED ORDER — LACTATED RINGERS IV SOLN: INTRAVENOUS | Status: DC | PRN

## 2021-01-18 MED ORDER — CHLORHEXIDINE GLUCONATE CLOTH 2 % EX PADS: 6.0000 | MEDICATED_PAD | Freq: Every day | CUTANEOUS | Status: DC

## 2021-01-18 MED ORDER — ENOXAPARIN SODIUM 40 MG/0.4ML IJ SOSY
40.0000 mg | PREFILLED_SYRINGE | INTRAMUSCULAR | Status: DC
  Administered 2021-01-19: 40 mg via SUBCUTANEOUS
  Filled 2021-01-18 (×2): qty 0.4

## 2021-01-18 MED ORDER — DEXMEDETOMIDINE (PRECEDEX) IN NS 20 MCG/5ML (4 MCG/ML) IV SYRINGE
PREFILLED_SYRINGE | INTRAVENOUS | Status: AC
  Filled 2021-01-18: qty 5

## 2021-01-18 MED ORDER — MAGNESIUM HYDROXIDE 400 MG/5ML PO SUSP
30.0000 mL | Freq: Every day | ORAL | Status: DC | PRN
  Filled 2021-01-18: qty 30

## 2021-01-18 MED ORDER — ROCURONIUM BROMIDE 100 MG/10ML IV SOLN
INTRAVENOUS | Status: DC | PRN
  Administered 2021-01-18: 50 mg via INTRAVENOUS
  Administered 2021-01-18: 10 mg via INTRAVENOUS

## 2021-01-18 MED ORDER — FENTANYL CITRATE (PF) 100 MCG/2ML IJ SOLN
INTRAMUSCULAR | Status: AC
  Filled 2021-01-18: qty 2

## 2021-01-18 MED ORDER — EPHEDRINE SULFATE 50 MG/ML IJ SOLN
INTRAMUSCULAR | Status: DC | PRN
  Administered 2021-01-18: 10 mg via INTRAVENOUS
  Administered 2021-01-18: 15 mg via INTRAVENOUS
  Administered 2021-01-18: 5 mg via INTRAVENOUS
  Administered 2021-01-18: 10 mg via INTRAVENOUS

## 2021-01-18 MED ORDER — ADULT MULTIVITAMIN W/MINERALS CH
1.0000 | ORAL_TABLET | Freq: Every day | ORAL | Status: DC
  Administered 2021-01-19: 1 via ORAL
  Filled 2021-01-18 (×2): qty 1

## 2021-01-18 MED ORDER — KETAMINE HCL 50 MG/ML IJ SOLN
INTRAMUSCULAR | Status: DC | PRN
  Administered 2021-01-18: 30 mg via INTRAMUSCULAR

## 2021-01-18 MED ORDER — SUGAMMADEX SODIUM 200 MG/2ML IV SOLN
INTRAVENOUS | Status: DC | PRN
  Administered 2021-01-18: 150 mg via INTRAVENOUS

## 2021-01-18 MED ORDER — LIDOCAINE HCL (CARDIAC) PF 100 MG/5ML IV SOSY
PREFILLED_SYRINGE | INTRAVENOUS | Status: DC | PRN
  Administered 2021-01-18: 80 mg via INTRAVENOUS

## 2021-01-18 MED ORDER — DEXMEDETOMIDINE (PRECEDEX) IN NS 20 MCG/5ML (4 MCG/ML) IV SYRINGE
PREFILLED_SYRINGE | INTRAVENOUS | Status: DC | PRN
  Administered 2021-01-18 (×2): 4 ug via INTRAVENOUS

## 2021-01-18 MED ORDER — PROPOFOL 10 MG/ML IV BOLUS
INTRAVENOUS | Status: DC | PRN
  Administered 2021-01-18: 80 mg via INTRAVENOUS

## 2021-01-18 MED ORDER — FENTANYL CITRATE (PF) 100 MCG/2ML IJ SOLN: 25.0000 ug | INTRAMUSCULAR | Status: DC | PRN

## 2021-01-18 MED ORDER — HYDROMORPHONE HCL 1 MG/ML IJ SOLN
0.5000 mg | INTRAMUSCULAR | Status: DC | PRN
  Administered 2021-01-18: 0.5 mg via INTRAVENOUS
  Filled 2021-01-18: qty 0.5

## 2021-01-18 MED ORDER — ONDANSETRON HCL 4 MG/2ML IJ SOLN
INTRAMUSCULAR | Status: AC
  Filled 2021-01-18: qty 2

## 2021-01-18 MED ORDER — DEXAMETHASONE SODIUM PHOSPHATE 10 MG/ML IJ SOLN
INTRAMUSCULAR | Status: DC | PRN
  Administered 2021-01-18: 10 mg via INTRAVENOUS

## 2021-01-18 MED ORDER — DOCUSATE SODIUM 100 MG PO CAPS
100.0000 mg | ORAL_CAPSULE | Freq: Two times a day (BID) | ORAL | Status: DC
  Administered 2021-01-18 – 2021-01-19 (×2): 100 mg via ORAL
  Filled 2021-01-18 (×3): qty 1

## 2021-01-18 MED ORDER — ONDANSETRON HCL 4 MG/2ML IJ SOLN
4.0000 mg | Freq: Four times a day (QID) | INTRAMUSCULAR | Status: DC | PRN
  Administered 2021-01-18: 4 mg via INTRAVENOUS

## 2021-01-18 MED ORDER — KETOROLAC TROMETHAMINE 30 MG/ML IJ SOLN
INTRAMUSCULAR | Status: DC | PRN
  Administered 2021-01-18: 15 mg via INTRAVENOUS

## 2021-01-18 MED ORDER — SODIUM CHLORIDE 0.9 % IV SOLN
INTRAVENOUS | Status: AC
  Filled 2021-01-18: qty 10

## 2021-01-18 MED ORDER — FENTANYL CITRATE (PF) 100 MCG/2ML IJ SOLN
INTRAMUSCULAR | Status: DC | PRN
  Administered 2021-01-18: 50 ug via INTRAVENOUS
  Administered 2021-01-18 (×2): 25 ug via INTRAVENOUS

## 2021-01-18 MED ORDER — TRANEXAMIC ACID-NACL 1000-0.7 MG/100ML-% IV SOLN
1000.0000 mg | Freq: Once | INTRAVENOUS | Status: AC
  Administered 2021-01-18: 1000 mg via INTRAVENOUS
  Filled 2021-01-18: qty 100

## 2021-01-18 MED ORDER — ACETAMINOPHEN 10 MG/ML IV SOLN
INTRAVENOUS | Status: AC
  Filled 2021-01-18: qty 100

## 2021-01-18 MED ORDER — MENTHOL 3 MG MT LOZG
1.0000 | LOZENGE | OROMUCOSAL | Status: DC | PRN
  Filled 2021-01-18: qty 9

## 2021-01-18 MED ORDER — METOCLOPRAMIDE HCL 5 MG/ML IJ SOLN: 5.0000 mg | Freq: Three times a day (TID) | INTRAMUSCULAR | Status: DC | PRN

## 2021-01-18 MED ORDER — EPHEDRINE 5 MG/ML INJ
INTRAVENOUS | Status: AC
  Filled 2021-01-18: qty 10

## 2021-01-18 MED ORDER — METOCLOPRAMIDE HCL 10 MG PO TABS
5.0000 mg | ORAL_TABLET | Freq: Three times a day (TID) | ORAL | Status: DC | PRN
  Filled 2021-01-18: qty 1

## 2021-01-18 MED ORDER — ALUM & MAG HYDROXIDE-SIMETH 200-200-20 MG/5ML PO SUSP: 30.0000 mL | ORAL | Status: DC | PRN

## 2021-01-18 MED ORDER — KETAMINE HCL 50 MG/ML IJ SOLN
INTRAMUSCULAR | Status: AC
  Filled 2021-01-18: qty 10

## 2021-01-18 MED ORDER — MAGNESIUM CITRATE PO SOLN
1.0000 | Freq: Once | ORAL | Status: DC | PRN
  Filled 2021-01-18: qty 296

## 2021-01-18 MED ORDER — ROCURONIUM BROMIDE 10 MG/ML (PF) SYRINGE
PREFILLED_SYRINGE | INTRAVENOUS | Status: AC
  Filled 2021-01-18: qty 10

## 2021-01-18 MED ORDER — ONDANSETRON HCL 4 MG PO TABS: 4.0000 mg | ORAL_TABLET | Freq: Four times a day (QID) | ORAL | Status: DC | PRN

## 2021-01-18 MED ORDER — NEOMYCIN-POLYMYXIN B GU 40-200000 IR SOLN
Status: AC
  Filled 2021-01-18: qty 2

## 2021-01-18 MED ORDER — DEXAMETHASONE SODIUM PHOSPHATE 10 MG/ML IJ SOLN
INTRAMUSCULAR | Status: AC
  Filled 2021-01-18: qty 1

## 2021-01-18 MED ORDER — SODIUM CHLORIDE 0.9 % IV SOLN: INTRAVENOUS | Status: DC

## 2021-01-18 MED ORDER — ENSURE ENLIVE PO LIQD
237.0000 mL | Freq: Two times a day (BID) | ORAL | Status: DC
  Administered 2021-01-19: 237 mL via ORAL
  Filled 2021-01-18 (×4): qty 237

## 2021-01-18 MED ORDER — BISACODYL 10 MG RE SUPP: 10.0000 mg | Freq: Every day | RECTAL | Status: DC | PRN

## 2021-01-18 MED ORDER — PROPOFOL 10 MG/ML IV BOLUS
INTRAVENOUS | Status: AC
  Filled 2021-01-18: qty 20

## 2021-01-18 MED ORDER — KETOROLAC TROMETHAMINE 30 MG/ML IJ SOLN
INTRAMUSCULAR | Status: AC
  Filled 2021-01-18: qty 1

## 2021-01-18 MED ORDER — SODIUM CHLORIDE 0.9 % IR SOLN
Status: DC | PRN
  Administered 2021-01-18: 500 mL

## 2021-01-18 MED ORDER — LIDOCAINE HCL (PF) 2 % IJ SOLN
INTRAMUSCULAR | Status: AC
  Filled 2021-01-18: qty 5

## 2021-01-18 MED ORDER — ONDANSETRON HCL 4 MG/2ML IJ SOLN: 4.0000 mg | Freq: Once | INTRAMUSCULAR | Status: DC | PRN

## 2021-01-18 MED ORDER — PHENYLEPHRINE HCL (PRESSORS) 10 MG/ML IV SOLN
INTRAVENOUS | Status: AC
  Filled 2021-01-18: qty 1

## 2021-01-18 MED ORDER — CHLORHEXIDINE GLUCONATE CLOTH 2 % EX PADS: 6.0000 | MEDICATED_PAD | Freq: Once | CUTANEOUS | Status: DC

## 2021-01-18 MED ORDER — ONDANSETRON HCL 4 MG/2ML IJ SOLN: 4.0000 mg | Freq: Four times a day (QID) | INTRAMUSCULAR | Status: DC | PRN

## 2021-01-18 MED ORDER — SODIUM CHLORIDE 0.9 % IV SOLN
INTRAVENOUS | Status: DC | PRN
  Administered 2021-01-18: 20 ug/min via INTRAVENOUS

## 2021-01-18 MED ORDER — SODIUM CHLORIDE 0.9 % IV SOLN
1.0000 g | Freq: Four times a day (QID) | INTRAVENOUS | Status: AC
  Administered 2021-01-18 – 2021-01-19 (×3): 1 g via INTRAVENOUS
  Filled 2021-01-18: qty 10
  Filled 2021-01-18: qty 1
  Filled 2021-01-18 (×2): qty 10

## 2021-01-18 MED ORDER — ACETAMINOPHEN 10 MG/ML IV SOLN
INTRAVENOUS | Status: DC | PRN
  Administered 2021-01-18: 1000 mg via INTRAVENOUS

## 2021-01-18 MED ORDER — PHENOL 1.4 % MT LIQD
1.0000 | OROMUCOSAL | Status: DC | PRN
  Filled 2021-01-18: qty 177

## 2021-01-18 SURGICAL SUPPLY — 55 items
1.25 CERCLAGE WIRE ×2 IMPLANT
BIT DRILL CANN QC 4.3X180 (BIT) ×1 IMPLANT
BIT DRILL Q/COUPLING 1 (BIT) ×4 IMPLANT
CABLE 1.7 (Orthopedic Implant) ×10 IMPLANT
CANISTER SUCT 1200ML W/VALVE (MISCELLANEOUS) ×2 IMPLANT
CANISTER WOUND CARE 500ML ATS (WOUND CARE) ×2 IMPLANT
CHLORAPREP W/TINT 26 (MISCELLANEOUS) ×2 IMPLANT
COVER WAND RF STERILE (DRAPES) ×2 IMPLANT
DRAPE C-ARM XRAY 36X54 (DRAPES) ×2 IMPLANT
DRAPE C-ARMOR (DRAPES) ×2 IMPLANT
DRILL BIT 4.3MM (BIT) ×2
ELECT BLADE 6.5 EXT (BLADE) ×2 IMPLANT
ELECT REM PT RETURN 9FT ADLT (ELECTROSURGICAL) ×2
ELECTRODE REM PT RTRN 9FT ADLT (ELECTROSURGICAL) ×1 IMPLANT
GAUZE SPONGE 4X4 12PLY STRL (GAUZE/BANDAGES/DRESSINGS) ×2 IMPLANT
GAUZE XEROFORM 1X8 LF (GAUZE/BANDAGES/DRESSINGS) IMPLANT
GLOVE SURG SYN 9.0  PF PI (GLOVE) ×1
GLOVE SURG SYN 9.0 PF PI (GLOVE) ×1 IMPLANT
GLOVE SURG UNDER POLY LF SZ9 (GLOVE) ×2 IMPLANT
GOWN SRG 2XL LVL 4 RGLN SLV (GOWNS) ×1 IMPLANT
GOWN STRL NON-REIN 2XL LVL4 (GOWNS) ×1
GOWN STRL REUS W/ TWL LRG LVL3 (GOWN DISPOSABLE) ×1 IMPLANT
GOWN STRL REUS W/TWL LRG LVL3 (GOWN DISPOSABLE) ×1
HEMOVAC 400ML (MISCELLANEOUS)
KIT DRAIN HEMOVAC JP 7FR 400ML (MISCELLANEOUS) IMPLANT
KIT TURNOVER KIT A (KITS) ×2 IMPLANT
MANIFOLD NEPTUNE II (INSTRUMENTS) ×2 IMPLANT
MAT ABSORB  FLUID 56X50 GRAY (MISCELLANEOUS) ×1
MAT ABSORB FLUID 56X50 GRAY (MISCELLANEOUS) ×1 IMPLANT
NEEDLE FILTER BLUNT 18X 1/2SAF (NEEDLE)
NEEDLE FILTER BLUNT 18X1 1/2 (NEEDLE) IMPLANT
NS IRRIG 500ML POUR BTL (IV SOLUTION) ×2 IMPLANT
PACK HIP PROSTHESIS (MISCELLANEOUS) ×2 IMPLANT
PIN POSITION 4.5 THRED (Pin) ×10 IMPLANT
PLATE CONDYLAR 12 HOLE (Plate) ×2 IMPLANT
PREVENA INCISION MGT 90 150 (MISCELLANEOUS) ×2 IMPLANT
PREVENA PLUS ×2 IMPLANT
SCALPEL PROTECTED #10 DISP (BLADE) ×4 IMPLANT
SCREW CORT ST 4.5X50 (Screw) ×2 IMPLANT
SCREW CORTEX ST 4.5X26 (Screw) ×4 IMPLANT
SCREW CORTEX ST 4.5X52 (Screw) ×2 IMPLANT
SCREW LOCK 5X20 (Screw) ×2 IMPLANT
SCREW LOCK VA ST 5.0X26 (Screw) ×4 IMPLANT
SCREW LOCKING VA 5.0X38MM (Screw) ×2 IMPLANT
SCREW LOCKING VA 5.0X60MM (Screw) ×4 IMPLANT
SCREW LOCKING VA 5.0X70MM (Screw) ×2 IMPLANT
SCREW VA LOCKING 5.0X55 (Screw) ×2 IMPLANT
STAPLER SKIN PROX 35W (STAPLE) ×4 IMPLANT
SUT VIC AB 0 CT1 36 (SUTURE) ×4 IMPLANT
SUT VIC AB 1 CT1 36 (SUTURE) ×8 IMPLANT
SUT VIC AB 2-0 CT1 27 (SUTURE) ×2
SUT VIC AB 2-0 CT1 TAPERPNT 27 (SUTURE) ×2 IMPLANT
SYR 5ML LL (SYRINGE) ×2 IMPLANT
TAPE MICROFOAM 4IN (TAPE) IMPLANT
WIRE CERLCAGE COILS 1.25 10 (WIRE) ×2 IMPLANT

## 2021-01-18 NOTE — Anesthesia Procedure Notes (Signed)
Procedure Name: Intubation Date/Time: 01/18/2021 1:59 PM Performed by: Debe Coder, CRNA Pre-anesthesia Checklist: Patient identified, Emergency Drugs available, Suction available and Patient being monitored Patient Re-evaluated:Patient Re-evaluated prior to induction Oxygen Delivery Method: Circle system utilized Preoxygenation: Pre-oxygenation with 100% oxygen Induction Type: IV induction Ventilation: Mask ventilation without difficulty Laryngoscope Size: Mac and 4 Grade View: Grade I Tube type: Oral Tube size: 6.5 mm Number of attempts: 1 Airway Equipment and Method: Stylet and Oral airway Placement Confirmation: ETT inserted through vocal cords under direct vision,  positive ETCO2 and breath sounds checked- equal and bilateral Secured at: 20 cm Tube secured with: Tape Dental Injury: Teeth and Oropharynx as per pre-operative assessment

## 2021-01-18 NOTE — Op Note (Signed)
01/18/2021  4:13 PM  PATIENT:  Crystal Burch  85 y.o. female  PRE-OPERATIVE DIAGNOSIS:  periprosthetic fracture right femur  POST-OPERATIVE DIAGNOSIS:  periprosthetic fracture right femur  PROCEDURE:  Procedure(s): OPEN REDUCTION INTERNAL FIXATION (ORIF) PERIPROSTHETIC FRACTURE (Right)  SURGEON: Laurene Footman, MD  ASSISTANTS: none  ANESTHESIA:   general  EBL:  Total I/O In: 600 [I.V.:500; IV Piggyback:100] Out: 400 [Blood:400]  BLOOD ADMINISTERED:none  DRAINS: Incisional wound VAC   LOCAL MEDICATIONS USED:  NONE  SPECIMEN:  No Specimen  DISPOSITION OF SPECIMEN:  N/A  COUNTS:  YES  TOURNIQUET:  * No tourniquets in log *  IMPLANTS: Distal femoral locking plate from [employer] with multiple cerclage wires and screws  DICTATION: .Dragon Dictation patient was brought the operating room and after adequate general anesthesia was obtained a bump was placed underneath the right hip to internally rotate the leg and the thigh and leg were prepped and draped in the usual sterile fashion.  After patient identification and timeout procedures were completed and adequate visualization in both AP and lateral projections by the C arm were checked a proximal incision was made and the subcutaneous tissue incised down to the IT band which was split and the vastus lateralis elevated off the lateral femur.  The fracture site was exposed and a single small wire was placed around this to get provisional fixation of the fracture site with traction being applied at the end of the leg.  Next the plate was placed subcutaneously with a distal incision made and 3 cerclage wires placed proximally with a locking knot in the plate to hold the wire against the plate distally the plate was brought down to the shaft with a small gap between it with single wire and then locking screws were placed through the multiple screw holes around the distal femur with some of the screws being short secondary to the stemmed  femoral implant plant.  Additional wire was placed at the fracture site to give additional rigidity 30 fracture appeared stable to stress views and acceptable alignment in both AP and lateral projections was obtained.  There was cortical contact at that it with anatomic reduction of the spike which is slight varus deformity.  The wounds were thoroughly irrigated and then closed with a running #1 Vicryl for the IT band and 0 Vicryl subcutaneously followed by 2-0 Vicryl subcuticular and skin staples followed by incisional wound VAC.  PLAN OF CARE: Admit to inpatient   PATIENT DISPOSITION:  PACU - hemodynamically stable.

## 2021-01-18 NOTE — Plan of Care (Signed)
  Problem: Education: Goal: Knowledge of General Education information will improve Description: Including pain rating scale, medication(s)/side effects and non-pharmacologic comfort measures Outcome: Progressing   Problem: Pain Managment: Goal: General experience of comfort will improve Outcome: Progressing   Problem: Safety: Goal: Ability to remain free from injury will improve Outcome: Progressing   

## 2021-01-18 NOTE — Progress Notes (Signed)
Progress Note    Crystal Burch  XTK:[id number] DOB: 1935-10-02  DOA: 01/17/2021 PCP: Joy      Brief Narrative:    Medical records reviewed and are as summarized below:  Crystal Burch is a 85 y.o. 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  She presented to the hospital because of right lower extremity pain after a mechanical fall.  She was found to have right periprosthetic fracture of the femur.  She was treated with analgesics.  Orthopedic surgeon was consulted to assist with management.    Assessment/Plan:   Principal Problem:   Femur fracture, right (HCC) Active Problems:   Essential hypertension   Hyperlipidemia, mixed   Depression   Memory loss   GERD (gastroesophageal reflux disease)   Hypokalemia   Body mass index is 26.26 kg/m.    Right periprosthetic fracture of the femur, s/p fall: Change IV morphine to IV Dilaudid for adequate pain control.  Continue Vicodin.  Plan for surgical intervention today.  Follow-up with orthopedic surgeon.  Hypokalemia: Improved.  Continue to monitor.  Dementia: Continue Aricept    Diet Order            Diet NPO time specified  Diet effective midnight                    Consultants:  Orthopedic surgeon  Procedures:  None    Medications:   . atorvastatin  40 mg Oral QHS  .  ceFAZolin (ANCEF) IV  1 g Intravenous Once  . Chlorhexidine Gluconate Cloth  6 each Topical Once  . citalopram  10 mg Oral Daily  . donepezil  10 mg Oral QPM  . gabapentin  600 mg Oral Daily  . lisinopril  7.5 mg Oral QHS  . melatonin  2.5 mg Oral QHS  . montelukast  10 mg Oral QHS  . oxybutynin  5 mg Oral BID  .  [START ON 01/19/2021] pantoprazole  40 mg Oral q AM  . vitamin B-12  1,000 mcg Oral Daily   Continuous Infusions:   Anti-infectives (From admission, onward)   Start     Dose/Rate Route Frequency Ordered Stop   01/18/21 1500  ceFAZolin (ANCEF) 1 g in sodium chloride 0.9 % 100 mL IVPB        1 g 200 mL/hr over 30 Minutes Intravenous  Once 01/17/21 1609               Family Communication/Anticipated D/C date and plan/Code Status   DVT prophylaxis: SCDs Start: 01/17/21 1315     Code Status: Full Code  Family Communication: Ronalee Belts, son Disposition Plan: None   Status is: Inpatient  Remains inpatient appropriate because:Inpatient level of care appropriate due to severity of illness   Dispo: The patient is from: Home              Anticipated d/c is to: SNF              Patient currently is not medically stable to d/c.   Difficult to place patient No           Subjective:   C/o severe pain in the right thigh. Her son, Ronalee Belts, was at the bedside  Objective:  Vitals:   01/17/21 2201 01/18/21 0402 01/18/21 0838 01/18/21 1115  BP: 139/63 (!) 137/57 (!) 141/56 (!) 134/45  Pulse: 62 (!) 56 (!) 107 (!) 59  Resp: 16 16 18    Temp: 97.9 F (36.6 C) 98.2 F (36.8 C) 97.6 F (36.4 C) 98.2 F (36.8 C)  TempSrc: Oral Oral Oral   SpO2: 95% 96% 93% 98%  Weight:      Height:       No data found.   Intake/Output Summary (Last 24 hours) at 01/18/2021 1147 Last data filed at 01/18/2021 0644 Gross per 24 hour  Intake 240 ml  Output 150 ml  Net 90 ml   Filed Weights   01/17/21 1121  Weight: 61 kg    Exam:  GEN: NAD SKIN: Warm and dry EYES: No pallor or icterus ENT: MMM CV: RRR PULM: CTA B ABD: soft, ND, NT, +BS CNS: AAO x 3, non focal EXT: Swelling and tenderness of the right hip and right thigh.        Data Reviewed:   I have personally reviewed following labs and imaging studies:  Labs: Labs show the following:   Basic Metabolic  Panel: Recent Labs  Lab 01/17/21 1112 01/18/21 0356  NA 142 139  K 3.3* 4.0  CL 109 109  CO2 24 25  GLUCOSE 127* 124*  BUN 21 21  CREATININE 0.67 0.65  CALCIUM 9.0 8.8*  MG 2.0  --    GFR Estimated Creatinine Clearance: 44.3 mL/min (by C-G formula based on SCr of 0.65 mg/dL). Liver Function Tests: No results for input(s): AST, ALT, ALKPHOS, BILITOT, PROT, ALBUMIN in the last 168 hours. No results for input(s): LIPASE, AMYLASE in the last 168 hours. No results for input(s): AMMONIA in the last 168 hours. Coagulation profile Recent Labs  Lab 01/17/21 1112  INR 1.0    CBC: Recent Labs  Lab 01/17/21 1112 01/18/21 0356  WBC 7.0 6.9  NEUTROABS 4.4  --   HGB 12.4 10.0*  HCT 39.0 30.6*  MCV 97.5 96.2  PLT 122* 126*   Cardiac Enzymes: No results for input(s): CKTOTAL, CKMB, CKMBINDEX, TROPONINI in the last 168 hours. BNP (last 3 results) No results for input(s): PROBNP in the last 8760 hours. CBG: No results for input(s): GLUCAP in the last 168 hours. D-Dimer: No results for input(s): DDIMER in the last 72 hours. Hgb A1c: No results for input(s): HGBA1C in the last 72 hours. Lipid Profile: No results for input(s): CHOL, HDL, LDLCALC, TRIG, CHOLHDL, LDLDIRECT in the last 72 hours. Thyroid function studies: No results for input(s): TSH, T4TOTAL, T3FREE, THYROIDAB in the last 72 hours.  Invalid input(s): FREET3 Anemia work up: No results for input(s): VITAMINB12, FOLATE, FERRITIN, TIBC, IRON, RETICCTPCT in the last 72 hours. Sepsis Labs: Recent Labs  Lab 01/17/21 1112 01/18/21 0356  WBC 7.0 6.9    Microbiology Recent Results (from the past 240 hour(s))  Resp Panel by RT-PCR (Flu A&B, Covid) Nasopharyngeal Swab     Status: None   Collection Time: 01/17/21 11:12 AM   Specimen: Nasopharyngeal Swab; Nasopharyngeal(NP) swabs in vial transport medium  Result Value Ref Range Status   SARS Coronavirus 2 by RT PCR NEGATIVE NEGATIVE Final    Comment:  (NOTE) SARS-CoV-2 target nucleic acids are NOT DETECTED.  The SARS-CoV-2 RNA is generally detectable in upper respiratory specimens during the acute phase of infection. The lowest concentration of SARS-CoV-2 viral copies this assay can detect is 138 copies/mL. A negative result does not preclude  SARS-Cov-2 infection and should not be used as the sole basis for treatment or other patient management decisions. A negative result may occur with  improper specimen collection/handling, submission of specimen other than nasopharyngeal swab, presence of viral mutation(s) within the areas targeted by this assay, and inadequate number of viral copies(<138 copies/mL). A negative result must be combined with clinical observations, patient history, and epidemiological information. The expected result is Negative.  Fact Sheet for Patients:  EntrepreneurPulse.com.au  Fact Sheet for Healthcare Providers:  IncredibleEmployment.be  This test is no t yet approved or cleared by the [place] FDA and  has been authorized for detection and/or diagnosis of SARS-CoV-2 by FDA under an Emergency Use Authorization (EUA). This EUA will remain  in effect (meaning this test can be used) for the duration of the COVID-19 declaration under Section 564(b)(1) of the Act, 21 U.S.C.section 360bbb-3(b)(1), unless the authorization is terminated  or revoked sooner.       Influenza A by PCR NEGATIVE NEGATIVE Final   Influenza B by PCR NEGATIVE NEGATIVE Final    Comment: (NOTE) The Xpert Xpress SARS-CoV-2/FLU/RSV plus assay is intended as an aid in the diagnosis of influenza from Nasopharyngeal swab specimens and should not be used as a sole basis for treatment. Nasal washings and aspirates are unacceptable for Xpert Xpress SARS-CoV-2/FLU/RSV testing.  Fact Sheet for Patients: EntrepreneurPulse.com.au  Fact Sheet for Healthcare  Providers: IncredibleEmployment.be  This test is not yet approved or cleared by the [place] FDA and has been authorized for detection and/or diagnosis of SARS-CoV-2 by FDA under an Emergency Use Authorization (EUA). This EUA will remain in effect (meaning this test can be used) for the duration of the COVID-19 declaration under Section 564(b)(1) of the Act, 21 U.S.C. section 360bbb-3(b)(1), unless the authorization is terminated or revoked.  Performed at [facility], [address], [place], [place] [address]     Procedures and diagnostic studies:  DG Chest 1 View  Result Date: 01/17/2021 CLINICAL DATA:  Fall EXAM: CHEST  1 VIEW COMPARISON:  04/13/2019 FINDINGS: Cardiomegaly. Both lungs are clear. The visualized skeletal structures are unremarkable. IMPRESSION: Cardiomegaly without acute abnormality of the lungs in AP portable projection. Electronically Signed   By: Eddie Candle M.D.   On: 01/17/2021 12:43   DG Pelvis 1-2 Views  Result Date: 01/17/2021 CLINICAL DATA:  85 year old who fell while at home and complains of RIGHT leg pain. Initial encounter. EXAM: PELVIS - 1-2 VIEW COMPARISON:  Bone window images from CT pelvis 10/02/2008. AP pelvis x-ray 04/15/2007. FINDINGS: Prior RIGHT total hip arthroplasty with anatomic alignment. Prior ORIF of a LEFT femoral neck fracture with normal healing. Osseous demineralization. No evidence of acute fracture. Sacroiliac joints anatomically aligned without diastasis, demonstrating degenerative changes. Symphysis pubis anatomically aligned without diastasis. IMPRESSION: No acute osseous abnormality.  Osseous demineralization. Electronically Signed   By: Evangeline Dakin M.D.   On: 01/17/2021 12:45   DG Knee 1-2 Views Right  Result Date: 01/17/2021 CLINICAL DATA:  Knee pain after fall EXAM: RIGHT KNEE - 1-2 VIEW COMPARISON:  None. FINDINGS: Previous right knee arthroplasty. There is a obliquely oriented  periprosthetic fracture deformity involving the mid and distal diaphysis of the right femur. There is medial displacement of the distal fracture fragments with posterior angulation. IMPRESSION: Acute periprosthetic fracture involves the mid and distal diaphysis of the right femur. Medial displacement and posterior angulation of the distal fracture fragments. Electronically Signed   By: Kerby Moors M.D.   On: 01/17/2021 12:40  DG Femur Min 2 Views Right  Result Date: 01/17/2021 CLINICAL DATA:  Fall. EXAM: RIGHT FEMUR 2 VIEWS COMPARISON:  04/15/2007 FINDINGS: Previous right hip arthroplasty and right knee arthroplasty. Acute, obliquely oriented periprosthetic fracture is noted involving the mid and distal shaft of the right femur. There is medial displacement of the distal fracture fragments with posterior angulation. IMPRESSION: Acute, obliquely oriented periprosthetic fracture involves the mid and distal shaft of the right femur. Electronically Signed   By: Kerby Moors M.D.   On: 01/17/2021 12:41               LOS: 1 day   Idalis Hoelting  [facility]   Pager on [email or web address]. If 7PM-7AM, please contact night-coverage at [email or web address]     01/18/2021, 11:47 AM

## 2021-01-18 NOTE — Clinical Social Work Note (Signed)
Received call from Crystal Burch. Patient has home care from 1:00 pm-3:00 pm 7 days per week. She only comes into their center as needed.  Dayton Scrape, [place]

## 2021-01-18 NOTE — Anesthesia Postprocedure Evaluation (Signed)
Anesthesia Post Note  Patient: Crystal Burch  Procedure(s) Performed: OPEN REDUCTION INTERNAL FIXATION (ORIF) PERIPROSTHETIC FRACTURE (Right )  Patient location during evaluation: PACU Anesthesia Type: General Level of consciousness: awake and alert Pain management: pain level controlled Vital Signs Assessment: post-procedure vital signs reviewed and stable Respiratory status: spontaneous breathing, nonlabored ventilation, respiratory function stable and patient connected to nasal cannula oxygen Cardiovascular status: blood pressure returned to baseline and stable Postop Assessment: no apparent nausea or vomiting Anesthetic complications: no   No complications documented.   Last Vitals:  Vitals:   01/18/21 1645 01/18/21 1700  BP: 129/72 (!) 119/54  Pulse: 93 94  Resp: 11 19  Temp:  36.8 C  SpO2: 100% 93%    Last Pain:  Vitals:   01/18/21 1645  TempSrc:   PainSc: 0-No pain                 Precious Haws [place]

## 2021-01-18 NOTE — Anesthesia Preprocedure Evaluation (Addendum)
Anesthesia Evaluation  Patient identified by MRN, date of birth, ID band Patient awake    Reviewed: Allergy & Precautions, NPO status , Patient's Chart, lab work & pertinent test results  Airway Mallampati: III       Dental  (+) Upper Dentures, Lower Dentures   Pulmonary neg pulmonary ROS,    Pulmonary exam normal        Cardiovascular hypertension, Normal cardiovascular exam     Neuro/Psych PSYCHIATRIC DISORDERS Depression Dementia    GI/Hepatic Neg liver ROS, GERD  ,  Endo/Other  negative endocrine ROS  Renal/GU negative Renal ROS  negative genitourinary   Musculoskeletal negative musculoskeletal ROS (+)   Abdominal Normal abdominal exam  (+)   Peds negative pediatric ROS (+)  Hematology negative hematology ROS (+)   Anesthesia Other Findings Past Medical History: No date: Dementia ([place]) No date: Hypertension Charcot-Marie-tooth disease and some remote hx of intracranial cyst vs. Menigioma. EF 65%  Reproductive/Obstetrics                           Anesthesia Physical Anesthesia Plan  ASA: III  Anesthesia Plan: General   Post-op Pain Management:    Induction: Intravenous  PONV Risk Score and Plan:   Airway Management Planned: Oral ETT  Additional Equipment:   Intra-op Plan:   Post-operative Plan: Extubation in OR  Informed Consent: I have reviewed the patients History and Physical, chart, labs and discussed the procedure including the risks, benefits and alternatives for the proposed anesthesia with the patient or authorized representative who has indicated his/her understanding and acceptance.     Dental advisory given  Plan Discussed with:   Anesthesia Plan Comments: (Talked with the patient and son, who is the POA, and they prefer GOT over regional block.  They are aware of risks of both anesthetic techniques and preger general.)       Anesthesia Quick  Evaluation

## 2021-01-18 NOTE — Clinical Social Work Note (Signed)
CSW acknowledges consult for SNF placement. Please consult PT and OT when appropriate so patient can be evaluated.   Dayton Scrape, [place]

## 2021-01-18 NOTE — Progress Notes (Signed)
Initial Nutrition Assessment  DOCUMENTATION CODES:   Not applicable  INTERVENTION:   Ensure Enlive po BID, each supplement provides 350 kcal and 20 grams of protein  Magic cup TID with meals, each supplement provides 290 kcal and 9 grams of protein  MVI po daily   NUTRITION DIAGNOSIS:   Increased nutrient needs related to hip fracture as evidenced by estimated needs.  GOAL:   Patient will meet greater than or equal to 90% of their needs  MONITOR:   PO intake,Supplement acceptance,Labs,Weight trends,Skin,I & O's  REASON FOR ASSESSMENT:   Malnutrition Screening Tool    ASSESSMENT:   85 y.o. female with medical history significant for hypertension, hyperlipidemia, neuropathy, depression/anxiety, dementia/memory loss without behavioral disturbances, Charcot Mary tooth disease, status post total knee replacement right in 03/25/2015, status post total hip replacement on the right in 04/30/2012 with conversion in 2017, large hiatal hernia, lacunar infarction and meningioma of the brain who presents to the emergency department with hip fracture after a fall.   Unable to see patient today as patient in surgery at time of RD visit. Pt with increased estimated needs r/t hip fracture. Suspect pt with decreased appetite and oral intake at baseline r/t dementia. Pt was eating 30% of meals on 5/15. RD will add supplements and MVI to help pt meet her estimated needs and to support wound healing. RD will obtain nutrition related history and exam at follow-up.   There is no recent weight history in chart to determine if any significant recent weight changes.   Medications reviewed and include: celexa, melatonin, protonix, B12  Labs reviewed: K 4.0 wnl Hgb 10.0(L), Hct 30.6(L)  NUTRITION - FOCUSED PHYSICAL EXAM: Unable to perform at this time   Diet Order:   Diet Order            Diet NPO time specified  Diet effective midnight                EDUCATION NEEDS:   No education needs  have been identified at this time  Skin:  Skin Assessment: Reviewed RN Assessment  Last BM:  5/14  Height:   Ht Readings from Last 1 Encounters:  01/17/21 5' (1.524 m)    Weight:   Wt Readings from Last 1 Encounters:  01/17/21 61 kg    Ideal Body Weight:  45.45 kg  BMI:  Body mass index is 26.26 kg/m.  Estimated Nutritional Needs:   Kcal:  1400-1600kcal/day  Protein:  70-80g/day  Fluid:  1.2-1.4L/day  Koleen Distance MS, RD, LDN Please refer to [facility] for RD and/or RD on-call/weekend/after hours pager

## 2021-01-18 NOTE — Progress Notes (Signed)
Patient in OR at this time having surgery. Patient to be transferred to 1A after procedure.  Report given to charge nurse on 1A. Care relinquished.

## 2021-01-18 NOTE — Transfer of Care (Signed)
Immediate Anesthesia Transfer of Care Note  Patient: Crystal Burch  Procedure(s) Performed: OPEN REDUCTION INTERNAL FIXATION (ORIF) PERIPROSTHETIC FRACTURE (Right )  Patient Location: PACU  Anesthesia Type:General  Level of Consciousness: awake, drowsy and patient cooperative  Airway & Oxygen Therapy: Patient Spontanous Breathing and Patient connected to face mask oxygen  Post-op Assessment: Report given to RN and Post -op Vital signs reviewed and stable  Post vital signs: Reviewed and stable  Last Vitals:  Vitals Value Taken Time  BP 131/57 01/18/21 1618  Temp    Pulse 103 01/18/21 1624  Resp 17 01/18/21 1624  SpO2 100 % 01/18/21 1624  Vitals shown include unvalidated device data.  Last Pain:  Vitals:   01/18/21 1308  TempSrc: Temporal  PainSc: 5          Complications: No complications documented.

## 2021-01-19 DIAGNOSIS — S72491D Other fracture of lower end of right femur, subsequent encounter for closed fracture with routine healing: Secondary | ICD-10-CM | POA: Diagnosis not present

## 2021-01-19 LAB — CBC
HCT: 23.2 % — ABNORMAL LOW (ref 36.0–46.0)
Hemoglobin: 7.4 g/dL — ABNORMAL LOW (ref 12.0–15.0)
MCH: 31.5 pg (ref 26.0–34.0)
MCHC: 31.9 g/dL (ref 30.0–36.0)
MCV: 98.7 fL (ref 80.0–100.0)
Platelets: 118 10*3/uL — ABNORMAL LOW (ref 150–400)
RBC: 2.35 MIL/uL — ABNORMAL LOW (ref 3.87–5.11)
RDW: 14.4 % (ref 11.5–15.5)
WBC: 8.8 10*3/uL (ref 4.0–10.5)
nRBC: 0 % (ref 0.0–0.2)

## 2021-01-19 LAB — BASIC METABOLIC PANEL
Anion gap: 7 (ref 5–15)
BUN: 23 mg/dL (ref 8–23)
CO2: 24 mmol/L (ref 22–32)
Calcium: 8.5 mg/dL — ABNORMAL LOW (ref 8.9–10.3)
Chloride: 107 mmol/L (ref 98–111)
Creatinine, Ser: 0.84 mg/dL (ref 0.44–1.00)
GFR, Estimated: >60 mL/min (ref >60)
Glucose, Bld: 167 mg/dL — ABNORMAL HIGH (ref 70–99)
Potassium: 4 mmol/L (ref 3.5–5.1)
Sodium: 138 mmol/L (ref 135–145)

## 2021-01-19 MED ORDER — FE FUMARATE-B12-VIT C-FA-IFC PO CAPS
1.0000 | ORAL_CAPSULE | Freq: Two times a day (BID) | ORAL | Status: DC
  Administered 2021-01-19: 1 via ORAL
  Filled 2021-01-19 (×7): qty 1

## 2021-01-19 NOTE — Progress Notes (Signed)
Progress Note    Crystal Burch  DXI:[id number] DOB: Mar 06, 1936  DOA: 01/17/2021 PCP: [place]      Brief Narrative:    Medical records reviewed and are as summarized below:  Crystal Burch is a 85 y.o. 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  She presented to the hospital because of right lower extremity pain after a mechanical fall.  She was found to have right periprosthetic fracture of the femur.  She was treated with analgesics.  Orthopedic surgeon was consulted to assist with management.  She underwent ORIF periprosthetic fracture (right) on 01/18/2021.       Assessment/Plan:   Principal Problem:   Femur fracture, right (HCC) Active Problems:   Essential hypertension   Hyperlipidemia, mixed   Depression   Memory loss   GERD (gastroesophageal reflux disease)   Hypokalemia   Nutrition Problem: Increased nutrient needs Etiology: hip fracture  Signs/Symptoms: estimated needs   Body mass index is 26.26 kg/m.    Right periprosthetic fracture of the femur, s/p fall:  S/p ORIF periprosthetic fracture (right) on 01/18/2021.  Continue analgesics as needed for pain.  Follow-up with orthopedic surgeon.  PT and OT evaluation.  Acute blood loss anemia: Monitor H&H. No indication for blood transfusion at this time.  Hypokalemia: Improved.  Continue to monitor.  Dementia: Continue Aricept  Chronic thrombocytopenia: Platelet count is stable.  Follow-up with social worker to assist with disposition.   Diet Order            Diet regular Room service appropriate? Yes; Fluid consistency: Thin  Diet effective now                     Consultants:  Orthopedic surgeon  Procedures:  ORIF periprosthetic fracture (right) on 01/18/2021    Medications:   . atorvastatin  40 mg Oral QHS  .  ceFAZolin (ANCEF) IV  1 g Intravenous Q6H  . Chlorhexidine Gluconate Cloth  6 each Topical Once  . citalopram  10 mg Oral Daily  . docusate sodium  100 mg Oral BID  . donepezil  10 mg Oral QPM  . enoxaparin (LOVENOX) injection  40 mg Subcutaneous Q24H  . feeding supplement  237 mL Oral BID BM  . ferrous [id number] C-folic acid  1 capsule Oral BID  . gabapentin  600 mg Oral Daily  . lisinopril  7.5 mg Oral QHS  . melatonin  2.5 mg Oral QHS  . montelukast  10 mg Oral QHS  . multivitamin with minerals  1 tablet Oral Daily  . oxybutynin  5 mg Oral BID  . pantoprazole  40 mg Oral q AM  . vitamin B-12  1,000 mcg Oral Daily   Continuous Infusions:    Anti-infectives (From admission, onward)   Start     Dose/Rate Route Frequency Ordered Stop   01/18/21 2000  ceFAZolin (ANCEF) 1 g in sodium chloride 0.9 % 100 mL IVPB        1 g 200 mL/hr over 30 Minutes Intravenous Every 6 hours 01/18/21 1802 01/19/21 1359   01/18/21 1500  ceFAZolin (ANCEF) 1 g in sodium chloride 0.9 % 100 mL IVPB        1 g 200 mL/hr over 30 Minutes Intravenous  Once 01/17/21 1609 01/18/21 1349   01/18/21 1317  sodium chloride 0.9 % with ceFAZolin (ANCEF) ADS Med       Note to Pharmacy: Norton Blizzard  : cabinet override      01/18/21 1317 01/18/21 1349             Family Communication/Anticipated D/C date and plan/Code Status   DVT prophylaxis: enoxaparin (LOVENOX) injection 40 mg Start: 01/19/21 0800 SCDs Start: 01/18/21 1803 Place TED hose Start: 01/18/21 1803 SCDs Start: 01/17/21 1315     Code Status: Full Code  Family Communication: None Disposition Plan:    Status is: Inpatient  Remains inpatient appropriate because:Inpatient level of care appropriate due to severity of illness   Dispo: The patient is  from: Home              Anticipated d/c is to: Home              Patient currently is not medically stable to d/c.   Difficult to place patient No           Subjective:   Interval events noted.  She appears sedated and unable to provide any history.  Objective:    Vitals:   01/18/21 1944 01/19/21 0052 01/19/21 0335 01/19/21 0730  BP: (!) 109/51 (!) 119/51 (!) 131/55 (!) 127/56  Pulse: 87 84 86 88  Resp: 16 18 16 15   Temp: 98.4 F (36.9 C) 98.6 F (37 C) 98.5 F (36.9 C) 98.1 F (36.7 C)  TempSrc: Oral Oral Oral Oral  SpO2: 92% 93% 94% 95%  Weight:      Height:       No data found.   Intake/Output Summary (Last 24 hours) at 01/19/2021 1122 Last data filed at 01/19/2021 0640 Gross per 24 hour  Intake 870 ml  Output 950 ml  Net -80 ml   Filed Weights   01/17/21 1121  Weight: 61 kg    Exam:  GEN: NAD SKIN: No rash EYES: No pallor or icterus ENT: MMM CV: RRR PULM: CTA B ABD: soft, ND, NT, +BS CNS: Drowsy but arousable.  Limited exam because she appears sedated. EXT: Swelling and tenderness of the right thigh.  Surgical wound on right thigh connected to wound VAC.        Data Reviewed:   I have personally reviewed following labs and imaging studies:  Labs: Labs show the following:   Basic Metabolic Panel: Recent Labs  Lab 01/17/21 1112 01/18/21 0356 01/19/21 0559  NA 142 139 138  K 3.3* 4.0 4.0  CL 109 109 107  CO2 24 25 24   GLUCOSE 127* 124* 167*  BUN 21 21 23   CREATININE 0.67 0.65 0.84  CALCIUM 9.0 8.8* 8.5*  MG 2.0  --   --    GFR Estimated Creatinine Clearance: 42.1 mL/min (by C-G formula based on SCr of 0.84 mg/dL). Liver Function Tests: No results for input(s): AST, ALT, ALKPHOS, BILITOT, PROT, ALBUMIN in the last 168 hours. No results for input(s): LIPASE, AMYLASE in the last 168 hours. No results for input(s): AMMONIA in the last 168 hours. Coagulation profile Recent Labs  Lab 01/17/21 1112  INR 1.0     CBC: Recent Labs  Lab 01/17/21 1112 01/18/21 0356 01/19/21 0559  WBC 7.0 6.9 8.8  NEUTROABS 4.4  --   --   HGB 12.4 10.0* 7.4*  HCT 39.0 30.6* 23.2*  MCV 97.5 96.2 98.7  PLT 122* 126* 118*   Cardiac Enzymes: No results  for input(s): CKTOTAL, CKMB, CKMBINDEX, TROPONINI in the last 168 hours. BNP (last 3 results) No results for input(s): PROBNP in the last 8760 hours. CBG: No results for input(s): GLUCAP in the last 168 hours. D-Dimer: No results for input(s): DDIMER in the last 72 hours. Hgb A1c: No results for input(s): HGBA1C in the last 72 hours. Lipid Profile: No results for input(s): CHOL, HDL, LDLCALC, TRIG, CHOLHDL, LDLDIRECT in the last 72 hours. Thyroid function studies: No results for input(s): TSH, T4TOTAL, T3FREE, THYROIDAB in the last 72 hours.  Invalid input(s): FREET3 Anemia work up: No results for input(s): VITAMINB12, FOLATE, FERRITIN, TIBC, IRON, RETICCTPCT in the last 72 hours. Sepsis Labs: Recent Labs  Lab 01/17/21 1112 01/18/21 0356 01/19/21 0559  WBC 7.0 6.9 8.8    Microbiology Recent Results (from the past 240 hour(s))  Resp Panel by RT-PCR (Flu A&B, Covid) Nasopharyngeal Swab     Status: None   Collection Time: 01/17/21 11:12 AM   Specimen: Nasopharyngeal Swab; Nasopharyngeal(NP) swabs in vial transport medium  Result Value Ref Range Status   SARS Coronavirus 2 by RT PCR NEGATIVE NEGATIVE Final    Comment: (NOTE) SARS-CoV-2 target nucleic acids are NOT DETECTED.  The SARS-CoV-2 RNA is generally detectable in upper respiratory specimens during the acute phase of infection. The lowest concentration of SARS-CoV-2 viral copies this assay can detect is 138 copies/mL. A negative result does not preclude SARS-Cov-2 infection and should not be used as the sole basis for treatment or other patient management decisions. A negative result may occur with  improper specimen collection/handling, submission of specimen other than nasopharyngeal  swab, presence of viral mutation(s) within the areas targeted by this assay, and inadequate number of viral copies(<138 copies/mL). A negative result must be combined with clinical observations, patient history, and epidemiological information. The expected result is Negative.  Fact Sheet for Patients:  EntrepreneurPulse.com.au  Fact Sheet for Healthcare Providers:  IncredibleEmployment.be  This test is no t yet approved or cleared by the [place] FDA and  has been authorized for detection and/or diagnosis of SARS-CoV-2 by FDA under an Emergency Use Authorization (EUA). This EUA will remain  in effect (meaning this test can be used) for the duration of the COVID-19 declaration under Section 564(b)(1) of the Act, 21 U.S.C.section 360bbb-3(b)(1), unless the authorization is terminated  or revoked sooner.       Influenza A by PCR NEGATIVE NEGATIVE Final   Influenza B by PCR NEGATIVE NEGATIVE Final    Comment: (NOTE) The Xpert Xpress SARS-CoV-2/FLU/RSV plus assay is intended as an aid in the diagnosis of influenza from Nasopharyngeal swab specimens and should not be used as a sole basis for treatment. Nasal washings and aspirates are unacceptable for Xpert Xpress SARS-CoV-2/FLU/RSV testing.  Fact Sheet for Patients: EntrepreneurPulse.com.au  Fact Sheet for Healthcare Providers: IncredibleEmployment.be  This test is not yet approved or cleared by the [place] FDA and has been authorized for detection and/or diagnosis of SARS-CoV-2 by FDA under an Emergency Use Authorization (EUA). This EUA will remain in effect (meaning this test can be used) for the duration of the COVID-19 declaration under Section 564(b)(1) of the Act, 21 U.S.C. section 360bbb-3(b)(1), unless the authorization is terminated or revoked.  Performed at [facility], [address] [place], [place] [address]      Procedures and diagnostic studies:  DG Chest 1 View  Result Date: 01/17/2021 CLINICAL DATA:  Fall EXAM: CHEST  1 VIEW COMPARISON:  04/13/2019 FINDINGS: Cardiomegaly. Both lungs are  clear. The visualized skeletal structures are unremarkable. IMPRESSION: Cardiomegaly without acute abnormality of the lungs in AP portable projection. Electronically Signed   By: Eddie Candle M.D.   On: 01/17/2021 12:43   DG Pelvis 1-2 Views  Result Date: 01/17/2021 CLINICAL DATA:  85 year old who fell while at home and complains of RIGHT leg pain. Initial encounter. EXAM: PELVIS - 1-2 VIEW COMPARISON:  Bone window images from CT pelvis 10/02/2008. AP pelvis x-ray 04/15/2007. FINDINGS: Prior RIGHT total hip arthroplasty with anatomic alignment. Prior ORIF of a LEFT femoral neck fracture with normal healing. Osseous demineralization. No evidence of acute fracture. Sacroiliac joints anatomically aligned without diastasis, demonstrating degenerative changes. Symphysis pubis anatomically aligned without diastasis. IMPRESSION: No acute osseous abnormality.  Osseous demineralization. Electronically Signed   By: Evangeline Dakin M.D.   On: 01/17/2021 12:45   DG Knee 1-2 Views Right  Result Date: 01/17/2021 CLINICAL DATA:  Knee pain after fall EXAM: RIGHT KNEE - 1-2 VIEW COMPARISON:  None. FINDINGS: Previous right knee arthroplasty. There is a obliquely oriented periprosthetic fracture deformity involving the mid and distal diaphysis of the right femur. There is medial displacement of the distal fracture fragments with posterior angulation. IMPRESSION: Acute periprosthetic fracture involves the mid and distal diaphysis of the right femur. Medial displacement and posterior angulation of the distal fracture fragments. Electronically Signed   By: Kerby Moors M.D.   On: 01/17/2021 12:40   DG C-Arm 1-60 Min  Result Date: 01/18/2021 CLINICAL DATA:  Right femur surgery. EXAM: DG C-ARM 1-60 MIN; RIGHT FEMUR 2 VIEWS FLUOROSCOPY  TIME:  Fluoroscopy Time:  2 minutes and 18 seconds Number of Acquired Spot Images: 10 COMPARISON:  Jan 17, 2021. FINDINGS: Ten C-arm fluoroscopic images were obtained intraoperatively and submitted for post operative interpretation. These images demonstrate surgical changes of plate and screw fixation of a periprosthetic fracture with multiple cerclage wires. Final images demonstrate improved alignment, near anatomic. Partially imaged hip and knee arthroplasties. Please see the performing provider's procedural report for further detail. IMPRESSION: Intraoperative fluoroscopy, as detailed above. Electronically Signed   By: Margaretha Sheffield MD   On: 01/18/2021 18:14   DG C-Arm 1-60 Min  Result Date: 01/18/2021 CLINICAL DATA:  Right femur surgery. EXAM: DG C-ARM 1-60 MIN; RIGHT FEMUR 2 VIEWS FLUOROSCOPY TIME:  Fluoroscopy Time:  2 minutes and 18 seconds Number of Acquired Spot Images: 10 COMPARISON:  Jan 17, 2021. FINDINGS: Ten C-arm fluoroscopic images were obtained intraoperatively and submitted for post operative interpretation. These images demonstrate surgical changes of plate and screw fixation of a periprosthetic fracture with multiple cerclage wires. Final images demonstrate improved alignment, near anatomic. Partially imaged hip and knee arthroplasties. Please see the performing provider's procedural report for further detail. IMPRESSION: Intraoperative fluoroscopy, as detailed above. Electronically Signed   By: Margaretha Sheffield MD   On: 01/18/2021 18:14   DG FEMUR, MIN 2 VIEWS RIGHT  Result Date: 01/18/2021 CLINICAL DATA:  Right femur surgery. EXAM: DG C-ARM 1-60 MIN; RIGHT FEMUR 2 VIEWS FLUOROSCOPY TIME:  Fluoroscopy Time:  2 minutes and 18 seconds Number of Acquired Spot Images: 10 COMPARISON:  Jan 17, 2021. FINDINGS: Ten C-arm fluoroscopic images were obtained intraoperatively and submitted for post operative interpretation. These images demonstrate surgical changes of plate and screw fixation of  a periprosthetic fracture with multiple cerclage wires. Final images demonstrate improved alignment, near anatomic. Partially imaged hip and knee arthroplasties. Please see the performing provider's procedural report for further detail. IMPRESSION: Intraoperative fluoroscopy, as detailed above. Electronically Signed  By: Margaretha Sheffield MD   On: 01/18/2021 18:14   DG Femur Min 2 Views Right  Result Date: 01/17/2021 CLINICAL DATA:  Fall. EXAM: RIGHT FEMUR 2 VIEWS COMPARISON:  04/15/2007 FINDINGS: Previous right hip arthroplasty and right knee arthroplasty. Acute, obliquely oriented periprosthetic fracture is noted involving the mid and distal shaft of the right femur. There is medial displacement of the distal fracture fragments with posterior angulation. IMPRESSION: Acute, obliquely oriented periprosthetic fracture involves the mid and distal shaft of the right femur. Electronically Signed   By: Kerby Moors M.D.   On: 01/17/2021 12:41   DG FEMUR PORT, MIN 2 VIEWS RIGHT  Result Date: 01/18/2021 CLINICAL DATA:  Status post ORIF of femur fracture. EXAM: RIGHT FEMUR PORTABLE 2 VIEW COMPARISON:  01/17/2021 FINDINGS: Interval open reduction and internal fixation of the right femur fracture. There has been placement of a lateral sideplate and screw device. The fracture fragments are in near anatomic alignment. No complications. Previous right total hip arthroplasty and right total knee arthroplasty devices are again noted. IMPRESSION: Status post ORIF of right femur fracture. Electronically Signed   By: Kerby Moors M.D.   On: 01/18/2021 17:47               LOS: 2 days   Sulma Ruffino  [facility]   Pager on [email or web address]. If 7PM-7AM, please contact night-coverage at [email or web address]     01/19/2021, 11:22 AM

## 2021-01-19 NOTE — Evaluation (Signed)
Physical Therapy Evaluation Patient Details Name: Crystal Burch MRN: [id number] DOB: [birth year] Today's Date: 01/19/2021   History of Present Illness  The pt is an 85 y.o. female with medical history significant for hypertension, hyperlipidemia, neuropathy, depression/anxiety, dementia/memory loss without behavioral disturbances, Charcot Mary tooth disease, status post total knee replacement right in 03/25/2015, status post total hip replacement on the right in 04/30/2012 with conversion in 2017, large hiatal hernia, lacunar infarction and meningioma of the brain who presents to the emergency department with hip fracture after a fall. s/p ORIF of R femur 01/18/2021.    Clinical Impression  Patient intermittently with eye closed during session, but able to attend to PT when prompted. Able to follow simple one step commands with repetition, disoriented x4. Several LE exercises performed, PROM for RLE and pt exhibited 8/20 pain on the FACES scale throughout. Pt able to initiate supine to sit, still required maxA. Sitting balance with BUE and BLE support for a few minutes. Returned to supine maxA due to pts ongoing complaints of RLE pain. RN notified of pt status as well as need for potential assist for breakfast.  Overall the patient demonstrated deficits (see "PT Problem List") that impede the patient's functional abilities, safety, and mobility and would benefit from skilled PT intervention. Recommendation is SNF due to decline in functional status and current level of assistance needed.    Follow Up Recommendations SNF    Equipment Recommendations  Other (comment) (TBD at next venue of care)    Recommendations for Other Services       Precautions / Restrictions Precautions Precautions: Fall Restrictions Weight Bearing Restrictions: Yes RLE Weight Bearing: Weight bearing as tolerated      Mobility  Bed Mobility Overal bed mobility: Needs Assistance Bed Mobility: Supine to Sit;Sit to  Supine     Supine to sit: Max assist;HOB elevated Sit to supine: Max assist        Transfers                 General transfer comment: deferred due to increased pain  Ambulation/Gait                Stairs            Wheelchair Mobility    Modified Rankin (Stroke Patients Only)       Balance Overall balance assessment: Needs assistance Sitting-balance support: Feet supported;Bilateral upper extremity supported Sitting balance-Leahy Scale: Poor Sitting balance - Comments: reliant on external support                                     Pertinent Vitals/Pain Pain Assessment: Faces Faces Pain Scale: Hurts whole lot Pain Location: points/grabs R thigh/hip throughout session Pain Descriptors / Indicators: Guarding;Grimacing;Moaning Pain Intervention(s): Limited activity within patient's tolerance;Monitored during session;Repositioned    Home Living                        Prior Function           Comments: Pt unable to answer PLOF questions     Hand Dominance        Extremity/Trunk Assessment   Upper Extremity Assessment Upper Extremity Assessment: Difficult to assess due to impaired cognition    Lower Extremity Assessment Lower Extremity Assessment: Difficult to assess due to impaired cognition    Cervical / Trunk Assessment Cervical / Trunk Assessment: Kyphotic  Communication      Cognition Arousal/Alertness: Awake/alert Behavior During Therapy: Flat affect Overall Cognitive Status: No family/caregiver present to determine baseline cognitive functioning                                 General Comments: pt disoriented x4. able to follow simple commands with time, repetition      General Comments      Exercises General Exercises - Lower Extremity Short Arc Quad: AAROM;Strengthening;Both;10 reps Heel Slides: PROM;Right;10 reps;AAROM;Left   Assessment/Plan    PT Assessment Patient  needs continued PT services  PT Problem List Decreased strength;Decreased mobility;Decreased range of motion;Decreased activity tolerance;Decreased balance;Pain;Decreased knowledge of use of DME       PT Treatment Interventions DME instruction;Therapeutic exercise;Gait training;Balance training;Stair training;Neuromuscular re-education;Functional mobility training;Therapeutic activities;Patient/family education    PT Goals (Current goals can be found in the Care Plan section)  Acute Rehab PT Goals PT Goal Formulation: Patient unable to participate in goal setting Time For Goal Achievement: 02/02/21 Potential to Achieve Goals: Good    Frequency BID   Barriers to discharge        Co-evaluation               AM-PAC PT "6 Clicks" Mobility  Outcome Measure Help needed turning from your back to your side while in a flat bed without using bedrails?: A Lot Help needed moving from lying on your back to sitting on the side of a flat bed without using bedrails?: A Lot Help needed moving to and from a bed to a chair (including a wheelchair)?: Total Help needed standing up from a chair using your arms (e.g., wheelchair or bedside chair)?: Total Help needed to walk in hospital room?: Total Help needed climbing 3-5 steps with a railing? : Total 6 Click Score: 8    End of Session   Activity Tolerance: Patient limited by fatigue;Patient limited by pain Patient left: in bed;with call bell/phone within reach;with bed alarm set Nurse Communication: Mobility status PT Visit Diagnosis: Other abnormalities of gait and mobility (R26.89);Muscle weakness (generalized) (M62.81);Difficulty in walking, not elsewhere classified (R26.2);Pain Pain - Right/Left: Right Pain - part of body: Hip    Time: 8264-1583 PT Time Calculation (min) (ACUTE ONLY): 26 min   Charges:   PT Evaluation $PT Eval Low Complexity: 1 Low PT Treatments $Therapeutic Exercise: 8-22 mins $Therapeutic Activity: 8-22 mins         Lieutenant Diego PT, DPT 9:26 AM,01/19/21

## 2021-01-19 NOTE — NC FL2 (Signed)
[place] LEVEL OF CARE SCREENING TOOL     IDENTIFICATION  Patient Name: Crystal Burch Birthdate: 1935-12-18 Sex: female Admission Date (Current Location): 01/17/2021  [place] and [place] Number:  Engineering geologist and Address:  [facility], [address], [place], [place] [address]      Provider Number: [id number]  Attending Physician Name and Address:  Jennye Boroughs, MD  Relative Name and Phone Number:  Tylisa, Alcivar [facility]) (Son)   [phone] (Mobile)    Current Level of Care: SNF Recommended Level of Care: [place] Prior Approval Number:    Date Approved/Denied:   PASRR Number: [id number]  Discharge Plan: SNF    Current Diagnoses: Patient Active Problem List   Diagnosis Date Noted  . Femur fracture, right ([place]) 01/17/2021  . Essential hypertension 01/17/2021  . Hyperlipidemia, mixed 01/17/2021  . Depression 01/17/2021  . Memory loss 01/17/2021  . GERD (gastroesophageal reflux disease) 01/17/2021  . Hypokalemia 01/17/2021  . Syncope 04/13/2019    Orientation RESPIRATION BLADDER Height & Weight     Self,Time (Forgetful immediately after she is oriented)  Normal External catheter Weight: 61 kg Height:  5' (152.4 cm)  BEHAVIORAL SYMPTOMS/MOOD NEUROLOGICAL BOWEL NUTRITION STATUS     (very forgetful)   Diet (regular)  AMBULATORY STATUS COMMUNICATION OF NEEDS Skin   Extensive Assist (extra assist, as patient is in great pain) Verbally Surgical wounds (negative pressure surgical right leg dressing in place)                       Personal Care Assistance Level of Assistance  Bathing,Feeding,Dressing Bathing Assistance: Limited assistance Feeding assistance: Limited assistance Dressing Assistance: Limited assistance     Functional Limitations Info  Sight,Hearing,Speech Sight Info: Adequate Hearing Info: Adequate Speech Info: Adequate    SPECIAL CARE FACTORS FREQUENCY  PT (By licensed PT),OT  (By licensed OT)     PT Frequency: 5x minimal weekly OT Frequency: 5x minimal weekly            Contractures Contractures Info: Not present    Additional Factors Info  Code Status,Allergies Code Status Info: FULL Allergies Info: Quinapril   Tramadol Hcl           Current Medications (01/19/2021):  This is the current hospital active medication list Current Facility-Administered Medications  Medication Dose Route Frequency Provider Last Rate Last Admin  . albuterol (VENTOLIN HFA) 108 (90 Base) MCG/ACT inhaler 2 puff  2 puff Inhalation Q4H PRN Hessie Knows, MD      . alum & mag hydroxide-simeth (MAALOX/MYLANTA) 200-200-20 MG/5ML suspension 30 mL  30 mL Oral Q4H PRN Hessie Knows, MD      . atorvastatin (LIPITOR) tablet 40 mg  40 mg Oral QHS Hessie Knows, MD   40 mg at 01/18/21 2109  . bisacodyl (DULCOLAX) suppository 10 mg  10 mg Rectal Daily PRN Hessie Knows, MD      . Chlorhexidine Gluconate Cloth 2 % PADS 6 each  6 each Topical Once Hessie Knows, MD      . citalopram (CELEXA) tablet 10 mg  10 mg Oral Daily Hessie Knows, MD   10 mg at 01/19/21 0920  . docusate sodium (COLACE) capsule 100 mg  100 mg Oral BID Hessie Knows, MD   100 mg at 01/19/21 0920  . donepezil (ARICEPT) tablet 10 mg  10 mg Oral QPM Hessie Knows, MD   10 mg at 01/18/21 1825  . enoxaparin (LOVENOX) injection 40 mg  40 mg Subcutaneous Q24H Hessie Knows, MD   40 mg at 01/19/21 5188  . feeding supplement (ENSURE ENLIVE / ENSURE PLUS) liquid 237 mL  237 mL Oral BID BM Hessie Knows, MD   237 mL at 01/19/21 0930  . ferrous [id number] C-folic acid (TRINSICON / FOLTRIN) capsule 1 capsule  1 capsule Oral BID Duanne Guess, PA-C   1 capsule at 01/19/21 1318  . gabapentin (NEURONTIN) capsule 600 mg  600 mg Oral Daily Hessie Knows, MD   600 mg at 01/19/21 0920  . HYDROcodone-acetaminophen (NORCO/VICODIN) 5-325 MG per tablet 1-2 tablet  1-2 tablet Oral Q6H PRN Hessie Knows, MD   2 tablet at 01/19/21 0920   . HYDROmorphone (DILAUDID) injection 0.5 mg  0.5 mg Intravenous Q2H PRN Hessie Knows, MD   0.5 mg at 01/18/21 1040  . lisinopril (ZESTRIL) tablet 7.5 mg  7.5 mg Oral QHS Hessie Knows, MD   7.5 mg at 01/18/21 2109  . magnesium citrate solution 1 Bottle  1 Bottle Oral Once PRN Hessie Knows, MD      . magnesium hydroxide (MILK OF MAGNESIA) suspension 30 mL  30 mL Oral Daily PRN Hessie Knows, MD      . melatonin tablet 2.5 mg  2.5 mg Oral QHS Hessie Knows, MD   2.5 mg at 01/18/21 2109  . menthol-cetylpyridinium (CEPACOL) lozenge 3 mg  1 lozenge Oral PRN Hessie Knows, MD       Or  . phenol (CHLORASEPTIC) mouth spray 1 spray  1 spray Mouth/Throat PRN Hessie Knows, MD      . metoCLOPramide (REGLAN) tablet 5-10 mg  5-10 mg Oral Q8H PRN Hessie Knows, MD       Or  . metoCLOPramide (REGLAN) injection 5-10 mg  5-10 mg Intravenous Q8H PRN Hessie Knows, MD      . montelukast (SINGULAIR) tablet 10 mg  10 mg Oral QHS Hessie Knows, MD   10 mg at 01/18/21 2109  . multivitamin with minerals tablet 1 tablet  1 tablet Oral Daily Hessie Knows, MD   1 tablet at 01/19/21 0920  . ondansetron (ZOFRAN) tablet 4 mg  4 mg Oral Q6H PRN Hessie Knows, MD       Or  . ondansetron [facility]) injection 4 mg  4 mg Intravenous Q6H PRN Hessie Knows, MD      . oxybutynin (DITROPAN) tablet 5 mg  5 mg Oral BID Hessie Knows, MD   5 mg at 01/19/21 5732  . pantoprazole (PROTONIX) EC tablet 40 mg  40 mg Oral q AM Hessie Knows, MD   40 mg at 01/19/21 0610  . traZODone (DESYREL) tablet 50 mg  50 mg Oral QHS PRN Hessie Knows, MD      . vitamin B-12 (CYANOCOBALAMIN) tablet 1,000 mcg  1,000 mcg Oral Daily Hessie Knows, MD   1,000 mcg at 01/19/21 2025     Discharge Medications: Please see discharge summary for a list of discharge medications.  Relevant Imaging Results:  Relevant Lab Results:   Additional Information [id number]  Pete Pelt, RN

## 2021-01-19 NOTE — Progress Notes (Signed)
   Subjective: 1 Day Post-Op Procedure(s) (LRB): OPEN REDUCTION INTERNAL FIXATION (ORIF) PERIPROSTHETIC FRACTURE (Right) Patient reports pain as moderate and severe.   Patient sitting up on edge of bed participating with PT. Difficult historian.   Plan is to go Skilled nursing facility after hospital stay.  Objective: Vital signs in last 24 hours: Temp:  [97.1 F (36.2 C)-98.7 F (37.1 C)] 98.1 F (36.7 C) (05/17 0730) Pulse Rate:  [59-103] 88 (05/17 0730) Resp:  [9-19] 15 (05/17 0730) BP: (109-160)/(45-72) 127/56 (05/17 0730) SpO2:  [92 %-100 %] 95 % (05/17 0730)  Intake/Output from previous day: 05/16 0701 - 05/17 0700 In: 870 [P.O.:120; I.V.:500; IV Piggyback:250] Out: 950 [Urine:350; Drains:200; Blood:400] Intake/Output this shift: No intake/output data recorded.  Recent Labs    01/17/21 1112 01/18/21 0356 01/19/21 0559  HGB 12.4 10.0* 7.4*   Recent Labs    01/18/21 0356 01/19/21 0559  WBC 6.9 8.8  RBC 3.18* 2.35*  HCT 30.6* 23.2*  PLT 126* 118*   Recent Labs    01/18/21 0356 01/19/21 0559  NA 139 138  K 4.0 4.0  CL 109 107  CO2 25 24  BUN 21 23  CREATININE 0.65 0.84  GLUCOSE 124* 167*  CALCIUM 8.8* 8.5*   Recent Labs    01/17/21 1112  INR 1.0    EXAM General - Patient is Alert, Patient does not respond to questions appropriately. Extremity - No cellulitis present Compartment soft Dressing - dressing C/D/I, provena intact with 250 cc drainage Motor Function - intact, moving foot and toes well on exam  Past Medical History:  Diagnosis Date  . Dementia ([place])   . Hypertension     Assessment/Plan:   1 Day Post-Op Procedure(s) (LRB): OPEN REDUCTION INTERNAL FIXATION (ORIF) PERIPROSTHETIC FRACTURE (Right) Principal Problem:   Femur fracture, right ([facility]) Active Problems:   Essential hypertension   Hyperlipidemia, mixed   Depression   Memory loss   GERD (gastroesophageal reflux disease)   Hypokalemia  Estimated body mass index is  26.26 kg/m as calculated from the following:   Height as of this encounter: 5' (1.524 m).   Weight as of this encounter: 61 kg. Advance diet Up with therapy, WBAT RLE VSS Acute post op blood loss anemia - Hgb 7.4, start iron supplement. Recheck Hgb in the am. Transfuse if Hgb <7.  CM to assist with discharge to SNF   DVT Prophylaxis - Lovenox, TED hose and SCDs Weight-Bearing as tolerated to right leg   T. Rachelle Hora, PA-C [place] 01/19/2021, 9:05 AM

## 2021-01-19 NOTE — TOC Progression Note (Signed)
Transition of Care [facility]) - Progression Note    Patient Details  Name: Crystal Burch MRN: [id number] Date of Birth: 1936-02-27  Transition of Care [facility]) CM/SW [place], RN Phone Number: 01/19/2021, 4:06 PM  Clinical Narrative:   TOC in to see patient and son at bedside.  Patient lives at home with her husband.  No concerns regarding appointments and medications, as son assists with these.   Discharge plan is for patient to go to SNF.  Bedsearch started.  TOC contact information given, TOC to follow    Expected Discharge Plan: [place] Barriers to Discharge: Continued Medical Work up  Expected Discharge Plan and Services Expected Discharge Plan: [place]   Discharge Planning Services: CM Consult Post Acute Care Choice: [place] Living arrangements for the past 2 months: Single Family Home                 DME Arranged:  (n/a)                     Social Determinants of Health (SDOH) Interventions    Readmission Risk Interventions No flowsheet data found.

## 2021-01-19 NOTE — Progress Notes (Signed)
PT Cancellation Note  Patient Details Name: Crystal Burch MRN: [id number] DOB: Apr 17, 1936   Cancelled Treatment:    Reason Eval/Treat Not Completed: Other (comment): PT entered room. Patient lethargic and momentarily opened eyes. Aroused when named called but unable to keep eyes open or effectively communicate or participate. Called nurse to assess patient. PT to re attempt tomorrow.    Lewis Moccasin, PT 01/19/2021, 3:08 PM

## 2021-01-20 ENCOUNTER — Inpatient Hospital Stay: Payer: Medicare (Managed Care)

## 2021-01-20 DIAGNOSIS — R4189 Other symptoms and signs involving cognitive functions and awareness: Secondary | ICD-10-CM | POA: Diagnosis not present

## 2021-01-20 DIAGNOSIS — S72491D Other fracture of lower end of right femur, subsequent encounter for closed fracture with routine healing: Secondary | ICD-10-CM | POA: Diagnosis not present

## 2021-01-20 DIAGNOSIS — I639 Cerebral infarction, unspecified: Secondary | ICD-10-CM

## 2021-01-20 DIAGNOSIS — R413 Other amnesia: Secondary | ICD-10-CM | POA: Diagnosis not present

## 2021-01-20 DIAGNOSIS — I6322 Cerebral infarction due to unspecified occlusion or stenosis of basilar arteries: Secondary | ICD-10-CM

## 2021-01-20 DIAGNOSIS — I63211 Cerebral infarction due to unspecified occlusion or stenosis of right vertebral arteries: Secondary | ICD-10-CM

## 2021-01-20 LAB — CBC WITH DIFFERENTIAL/PLATELET
Abs Immature Granulocytes: 0.03 10*3/uL (ref 0.00–0.07)
Basophils Absolute: 0 10*3/uL (ref 0.0–0.1)
Basophils Relative: 0 %
Eosinophils Absolute: 0 10*3/uL (ref 0.0–0.5)
Eosinophils Relative: 0 %
HCT: 21 % — ABNORMAL LOW (ref 36.0–46.0)
Hemoglobin: 6.8 g/dL — ABNORMAL LOW (ref 12.0–15.0)
Immature Granulocytes: 1 %
Lymphocytes Relative: 22 %
Lymphs Abs: 1.4 10*3/uL (ref 0.7–4.0)
MCH: 31.3 pg (ref 26.0–34.0)
MCHC: 32.4 g/dL (ref 30.0–36.0)
MCV: 96.8 fL (ref 80.0–100.0)
Monocytes Absolute: 0.8 10*3/uL (ref 0.1–1.0)
Monocytes Relative: 12 %
Neutro Abs: 4 10*3/uL (ref 1.7–7.7)
Neutrophils Relative %: 65 %
Platelets: 117 10*3/uL — ABNORMAL LOW (ref 150–400)
RBC: 2.17 MIL/uL — ABNORMAL LOW (ref 3.87–5.11)
RDW: 14.7 % (ref 11.5–15.5)
WBC: 6.2 10*3/uL (ref 4.0–10.5)
nRBC: 0 % (ref 0.0–0.2)

## 2021-01-20 LAB — BLOOD GAS, ARTERIAL
Acid-Base Excess: 2 mmol/L (ref 0.0–2.0)
Bicarbonate: 26.5 mmol/L (ref 20.0–28.0)
FIO2: 21
O2 Saturation: 95.6 %
Patient temperature: 37
pCO2 arterial: 40 mmHg (ref 32.0–48.0)
pH, Arterial: 7.43 (ref 7.350–7.450)
pO2, Arterial: 77 mmHg — ABNORMAL LOW (ref 83.0–108.0)

## 2021-01-20 LAB — ABO/RH: ABO/RH(D): A NEG

## 2021-01-20 LAB — HEMOGLOBIN AND HEMATOCRIT, BLOOD
HCT: 28 % — ABNORMAL LOW (ref 36.0–46.0)
Hemoglobin: 9.3 g/dL — ABNORMAL LOW (ref 12.0–15.0)

## 2021-01-20 LAB — GLUCOSE, CAPILLARY
Glucose-Capillary: 108 mg/dL — ABNORMAL HIGH (ref 70–99)
Glucose-Capillary: 105 mg/dL — ABNORMAL HIGH (ref 70–99)

## 2021-01-20 LAB — PREPARE RBC (CROSSMATCH)

## 2021-01-20 MED ORDER — ASPIRIN 325 MG PO TABS
325.0000 mg | ORAL_TABLET | Freq: Every day | ORAL | Status: DC
  Filled 2021-01-20 (×3): qty 1

## 2021-01-20 MED ORDER — STROKE: EARLY STAGES OF RECOVERY BOOK: Freq: Once | Status: AC

## 2021-01-20 MED ORDER — SODIUM CHLORIDE 0.9% IV SOLUTION: Freq: Once | INTRAVENOUS | Status: DC

## 2021-01-20 MED ORDER — LABETALOL HCL 5 MG/ML IV SOLN
20.0000 mg | INTRAVENOUS | Status: DC | PRN
  Filled 2021-01-20: qty 4

## 2021-01-20 MED ORDER — SODIUM CHLORIDE 0.9 % IV SOLN: INTRAVENOUS | Status: DC

## 2021-01-20 MED ORDER — ASPIRIN 300 MG RE SUPP
300.0000 mg | Freq: Every day | RECTAL | Status: DC
  Administered 2021-01-20 – 2021-01-22 (×2): 300 mg via RECTAL
  Filled 2021-01-20 (×6): qty 1

## 2021-01-20 MED ORDER — SODIUM CHLORIDE 0.9% IV SOLUTION: Freq: Once | INTRAVENOUS | Status: AC

## 2021-01-20 NOTE — Consult Note (Addendum)
Referring Physician: Dr. Roosevelt Locks    Chief Complaint: Unresponsive with new strokes seen on CT head  HPI: Crystal Burch is an 85 y.o. female with a PMHx of HTN, HLD, Charcot-Marie-Tooth disease, neuropathy, prior lacunar stroke, meningioma, depression and dementia who initially presented to the hospital for evaluation of right lower extremity pain after a mechanical fall. She was diagnosed with a periprosthetic fracture of her right femur. She underwent ORIF of the periprosthetic fracture on Monday. The patient on RN checks overnight appeared to be resting comfortably, but did not awaken for a vital check this morning and Rapid Response was called. The patient did appear to have potential left sided droop with her mouth at rest, and upon stimulation she would make noise but not open her eyes. CT head revealednew low densities in the right cerebellar hemisphere, right parietal cortex and left occipital lobe concerning for subacute infarctions. The patient was out of the tPA time window at the time of the CT scan which showed the new strokes. She was started on ASA and Lipitor. Subsequent MRI confirms multifocal acute to subacute ischemic strokes in separate vascular territories, most likely to proximal embolic source. Also noted are multifocal chronic microhemorrhages and possible subarachnoid hemorrhage of indeterminate age involving one sulcus along the anterior right frontal lobe.   At her baseline she is awake and alert with cognitive symptoms secondary to her dementia. She is on ASA and atorvastatin at home.    Past Medical History:  Diagnosis Date  . Dementia ([place])   . Hypertension     Past Surgical History:  Procedure Laterality Date  . ABDOMINAL HYSTERECTOMY      Family History  Problem Relation Age of Onset  . Stroke Mother   . Hypertension Mother   . Stroke Father   . Hypertension Father   . Stroke Brother   . Hypertension Brother    Social History:  reports that she has never  smoked. She has never used smokeless tobacco. She reports previous alcohol use. No history on file for drug use.  Allergies:  Allergies  Allergen Reactions  . Quinapril Nausea And Vomiting    UNKNOWN REACTION OR SEVERITY REPORTED BY PATIENT  . Tramadol Hcl     Other reaction(s): ? itching, Pt called to report itching after taking 3 different doses June 2013, denies itching as of October 2013, med restarted    Medications:  Prior to Admission:  Medications Prior to Admission  Medication Sig Dispense Refill Last Dose  . aspirin EC 81 MG tablet Take 81 mg by mouth daily.   01/16/2021 at Unknown time  . atorvastatin (LIPITOR) 40 MG tablet Take 40 mg by mouth daily.    01/16/2021 at PM  . cetirizine (ZYRTEC) 10 MG tablet Take by mouth.   01/16/2021 at Unknown time  . citalopram (CELEXA) 10 MG tablet Take 10 mg by mouth daily.   01/16/2021 at Unknown time  . donepezil (ARICEPT) 10 MG tablet Take 10 mg by mouth every evening.    01/16/2021 at Unknown time  . gabapentin (NEURONTIN) 300 MG capsule Take 600 mg by mouth at bedtime.   01/16/2021 at PM  . lisinopril (ZESTRIL) 5 MG tablet Take 7.5 mg by mouth at bedtime.   01/16/2021 at PM  . melatonin 1 MG TABS tablet Take 1 mg by mouth at bedtime.   01/16/2021 at PM  . meloxicam (MOBIC) 7.5 MG tablet Take 7.5 mg by mouth daily.   01/16/2021 at Unknown time  .  montelukast (SINGULAIR) 10 MG tablet Take by mouth.   01/16/2021 at AM  . omeprazole (PRILOSEC) 20 MG capsule Take 20 mg by mouth daily.   01/16/2021 at Unknown time  . oxybutynin (DITROPAN) 5 MG tablet Take 5 mg by mouth 2 (two) times daily.   01/16/2021 at Unknown time  . PROAIR HFA 108 (90 Base) MCG/ACT inhaler Inhale 2 puffs into the lungs every 4 (four) hours as needed.   01/16/2021 at Unknown time  . tiZANidine (ZANAFLEX) 4 MG tablet Take 4 mg by mouth daily as needed.   01/16/2021 at Unknown time  . traZODone (DESYREL) 50 MG tablet Take 50 mg by mouth at bedtime as needed.   01/16/2021 at Unknown time   . trospium (SANCTURA) 20 MG tablet Take 20 mg by mouth at bedtime.   01/16/2021 at Unknown time  . vitamin B-12 (CYANOCOBALAMIN) 1000 MCG tablet Take 1,000 mcg by mouth daily.   01/16/2021 at Unknown time  . Docusate Sodium 100 MG capsule Take 100 mg by mouth 2 (two) times daily as needed.   PRN  . TYLENOL 325 MG tablet Take 650 mg by mouth every 8 (eight) hours as needed.   PRN   Scheduled: .  stroke: mapping our early stages of recovery book   Does not apply Once  . sodium chloride   Intravenous Once  . sodium chloride   Intravenous Once  . aspirin  300 mg Rectal Daily   Or  . aspirin  325 mg Oral Daily  . atorvastatin  40 mg Oral QHS  . Chlorhexidine Gluconate Cloth  6 each Topical Once  . citalopram  10 mg Oral Daily  . docusate sodium  100 mg Oral BID  . donepezil  10 mg Oral QPM  . enoxaparin (LOVENOX) injection  40 mg Subcutaneous Q24H  . feeding supplement  237 mL Oral BID BM  . ferrous [id number] C-folic acid  1 capsule Oral BID  . gabapentin  600 mg Oral Daily  . lisinopril  7.5 mg Oral QHS  . melatonin  2.5 mg Oral QHS  . montelukast  10 mg Oral QHS  . multivitamin with minerals  1 tablet Oral Daily  . oxybutynin  5 mg Oral BID  . pantoprazole  40 mg Oral q AM  . vitamin B-12  1,000 mcg Oral Daily   Continuous: . sodium chloride      ROS: Unable to obtain due to obtundation.   Physical Examination: Blood pressure (!) 151/53, pulse 85, temperature 98.4 F (36.9 C), temperature source Axillary, resp. rate 14, height 5' (1.524 m), weight 61 kg, SpO2 96 %.  HEENT: [place]/AT. No nuchal rigidity.  Lungs: No increased work of breathing. Sonorous respirations at times.  Ext: Warm and well-perfused. Drain and dressing to RLE is noted.   Neurologic Examination: Mental Status: Obtunded. No eye opening to any stimuli. Not responding to any verbal commands. No attempts to communicate. No purposeful movement to noxious.  Cranial Nerves: II:  Pupils 2 mm and reactive.  No blink to threat. III,IV, VI: Exotropia noted. Oculocephalic reflex intact. No forced gaze deviation.  V,VII: Grimaces on right and left in response to noxious brow ridge pressure on the same side.  VIII: No response to voice IX,X: Unable to assess XI: Unable to assess XII: Unable to assess.  Motor/Sensory: Flaccid tone BUE. No posturing or other movement to repeated arm pinch on each side.  Decreased tone BLE with weak knee flexion to repeated pinch applied to  LLE. Deferred detailed RLE exam due to hip fracture.  Deep Tendon Reflexes:  1+ bilateral brachioradialis and left patella. Deferred RLE reflex testing.  Cerebellar/Gait: Unable to assess   Results for orders placed or performed during the hospital encounter of 01/17/21 (from the past 48 hour(s))  CBC     Status: Abnormal   Collection Time: 01/19/21  5:59 AM  Result Value Ref Range   WBC 8.8 4.0 - 10.5 K/uL   RBC 2.35 (L) 3.87 - 5.11 MIL/uL   Hemoglobin 7.4 (L) 12.0 - 15.0 g/dL    Comment: REPEATED TO VERIFY   HCT 23.2 (L) 36.0 - 46.0 %   MCV 98.7 80.0 - 100.0 fL   MCH 31.5 26.0 - 34.0 pg   MCHC 31.9 30.0 - 36.0 g/dL   RDW 14.4 11.5 - 15.5 %   Platelets 118 (L) 150 - 400 K/uL    Comment: Immature Platelet Fraction may be clinically indicated, consider ordering this additional test [id number]    nRBC 0.0 0.0 - 0.2 %    Comment: Performed at [facility], [address] [place], [place] [id number]  Basic metabolic panel     Status: Abnormal   Collection Time: 01/19/21  5:59 AM  Result Value Ref Range   Sodium 138 135 - 145 mmol/L   Potassium 4.0 3.5 - 5.1 mmol/L   Chloride 107 98 - 111 mmol/L   CO2 24 22 - 32 mmol/L   Glucose, Bld 167 (H) 70 - 99 mg/dL    Comment: Glucose reference range applies only to samples taken after fasting for at least 8 hours.   BUN 23 8 - 23 mg/dL   Creatinine, Ser 0.84 0.44 - 1.00 mg/dL   Calcium 8.5 (L) 8.9 - 10.3 mg/dL   GFR, Estimated >60 >60 mL/min    Comment:  (NOTE) Calculated using the CKD-EPI Creatinine Equation (2021)    Anion gap 7 5 - 15    Comment: Performed at [facility], [address] [place], [place] [address]  CBC with Differential/Platelet     Status: Abnormal   Collection Time: 01/20/21  4:38 AM  Result Value Ref Range   WBC 6.2 4.0 - 10.5 K/uL   RBC 2.17 (L) 3.87 - 5.11 MIL/uL   Hemoglobin 6.8 (L) 12.0 - 15.0 g/dL   HCT 21.0 (L) 36.0 - 46.0 %   MCV 96.8 80.0 - 100.0 fL   MCH 31.3 26.0 - 34.0 pg   MCHC 32.4 30.0 - 36.0 g/dL   RDW 14.7 11.5 - 15.5 %   Platelets 117 (L) 150 - 400 K/uL    Comment: Immature Platelet Fraction may be clinically indicated, consider ordering this additional test [id number] CONSISTENT WITH PREVIOUS RESULT    nRBC 0.0 0.0 - 0.2 %   Neutrophils Relative % 65 %   Neutro Abs 4.0 1.7 - 7.7 K/uL   Lymphocytes Relative 22 %   Lymphs Abs 1.4 0.7 - 4.0 K/uL   Monocytes Relative 12 %   Monocytes Absolute 0.8 0.1 - 1.0 K/uL   Eosinophils Relative 0 %   Eosinophils Absolute 0.0 0.0 - 0.5 K/uL   Basophils Relative 0 %   Basophils Absolute 0.0 0.0 - 0.1 K/uL   Immature Granulocytes 1 %   Abs Immature Granulocytes 0.03 0.00 - 0.07 K/uL    Comment: Performed at [facility], [address], [place], [place] [address]  ABO/Rh     Status: None   Collection Time: 01/20/21  4:38 AM  Result Value Ref Range   ABO/RH(D)      A NEG Performed at [facility], [place], [place] [address]   Glucose, capillary     Status: Abnormal   Collection Time: 01/20/21  5:45 AM  Result Value Ref Range   Glucose-Capillary 108 (H) 70 - 99 mg/dL    Comment: Glucose reference range applies only to samples taken after fasting for at least 8 hours.  Blood gas, arterial     Status: Abnormal   Collection Time: 01/20/21  6:15 AM  Result Value Ref Range   FIO2 21.00    pH, Arterial 7.43 7.350 - 7.450   pCO2 arterial 40 32.0 - 48.0 mmHg   pO2, Arterial 77 (L) 83.0 - 108.0 mmHg    Bicarbonate 26.5 20.0 - 28.0 mmol/L   Acid-Base Excess 2.0 0.0 - 2.0 mmol/L   O2 Saturation 95.6 %   Patient temperature 37.0    Collection site RIGHT RADIAL    Sample type ARTERIAL DRAW    Allens test (pass/fail) PASS PASS    Comment: Performed at [facility], [address], [place], [place] [address]  Prepare RBC (crossmatch)     Status: None   Collection Time: 01/20/21  8:20 AM  Result Value Ref Range   Order Confirmation      ORDER PROCESSED BY BLOOD BANK Performed at [facility], [address] [place], [place] [address]   Glucose, capillary     Status: Abnormal   Collection Time: 01/20/21  8:36 AM  Result Value Ref Range   Glucose-Capillary 105 (H) 70 - 99 mg/dL    Comment: Glucose reference range applies only to samples taken after fasting for at least 8 hours.  Prepare RBC (crossmatch)     Status: None   Collection Time: 01/20/21 10:01 AM  Result Value Ref Range   Order Confirmation      DUPLICATE REQUEST Performed at [facility], [address] [place], [place] [address]    CT HEAD WO CONTRAST  Result Date: 01/20/2021 CLINICAL DATA:  Altered mental status. EXAM: CT HEAD WITHOUT CONTRAST TECHNIQUE: Contiguous axial images were obtained from the base of the skull through the vertex without intravenous contrast. COMPARISON:  April 13, 2019. FINDINGS: Brain: Mild chronic ischemic white matter disease is noted. New low densities are noted in the right cerebellar hemisphere and right parietal cortex as well as left occipital lobe. These are concerning for acute infarction, and MRI is recommended for further evaluation. Ventricular size is within normal limits. No midline shift is noted. No hemorrhage is noted. Vascular: No hyperdense vessel or unexpected calcification. Skull: Normal. Negative for fracture or focal lesion. Sinuses/Orbits: No acute finding. Other: None. IMPRESSION: New low densities are noted in the right cerebellar hemisphere, right  parietal cortex and left occipital lobe concerning for acute infarction. MRI is recommended for further evaluation. Electronically Signed   By: Marijo Conception M.D.   On: 01/20/2021 08:51   DG C-Arm 1-60 Min  Result Date: 01/18/2021 CLINICAL DATA:  Right femur surgery. EXAM: DG C-ARM 1-60 MIN; RIGHT FEMUR 2 VIEWS FLUOROSCOPY TIME:  Fluoroscopy Time:  2 minutes and 18 seconds Number of Acquired Spot Images: 10 COMPARISON:  Jan 17, 2021. FINDINGS: Ten C-arm fluoroscopic images were obtained intraoperatively and submitted for post operative interpretation. These images demonstrate surgical changes of plate and screw fixation of a periprosthetic fracture with multiple cerclage wires. Final images demonstrate improved alignment, near  anatomic. Partially imaged hip and knee arthroplasties. Please see the performing provider's procedural report for further detail. IMPRESSION: Intraoperative fluoroscopy, as detailed above. Electronically Signed   By: Feliberto Harts MD   On: 01/18/2021 18:14   DG C-Arm 1-60 Min  Result Date: 01/18/2021 CLINICAL DATA:  Right femur surgery. EXAM: DG C-ARM 1-60 MIN; RIGHT FEMUR 2 VIEWS FLUOROSCOPY TIME:  Fluoroscopy Time:  2 minutes and 18 seconds Number of Acquired Spot Images: 10 COMPARISON:  Jan 17, 2021. FINDINGS: Ten C-arm fluoroscopic images were obtained intraoperatively and submitted for post operative interpretation. These images demonstrate surgical changes of plate and screw fixation of a periprosthetic fracture with multiple cerclage wires. Final images demonstrate improved alignment, near anatomic. Partially imaged hip and knee arthroplasties. Please see the performing provider's procedural report for further detail. IMPRESSION: Intraoperative fluoroscopy, as detailed above. Electronically Signed   By: Feliberto Harts MD   On: 01/18/2021 18:14   DG FEMUR, MIN 2 VIEWS RIGHT  Result Date: 01/18/2021 CLINICAL DATA:  Right femur surgery. EXAM: DG C-ARM 1-60 MIN; RIGHT  FEMUR 2 VIEWS FLUOROSCOPY TIME:  Fluoroscopy Time:  2 minutes and 18 seconds Number of Acquired Spot Images: 10 COMPARISON:  Jan 17, 2021. FINDINGS: Ten C-arm fluoroscopic images were obtained intraoperatively and submitted for post operative interpretation. These images demonstrate surgical changes of plate and screw fixation of a periprosthetic fracture with multiple cerclage wires. Final images demonstrate improved alignment, near anatomic. Partially imaged hip and knee arthroplasties. Please see the performing provider's procedural report for further detail. IMPRESSION: Intraoperative fluoroscopy, as detailed above. Electronically Signed   By: Feliberto Harts MD   On: 01/18/2021 18:14   DG FEMUR PORT, MIN 2 VIEWS RIGHT  Result Date: 01/18/2021 CLINICAL DATA:  Status post ORIF of femur fracture. EXAM: RIGHT FEMUR PORTABLE 2 VIEW COMPARISON:  01/17/2021 FINDINGS: Interval open reduction and internal fixation of the right femur fracture. There has been placement of a lateral sideplate and screw device. The fracture fragments are in near anatomic alignment. No complications. Previous right total hip arthroplasty and right total knee arthroplasty devices are again noted. IMPRESSION: Status post ORIF of right femur fracture. Electronically Signed   By: Signa Kell M.D.   On: 01/18/2021 17:47    Assessment: 85 y.o. female with a PMHx of HTN, HLD, Charcot-Marie-Tooth disease, neuropathy, prior lacunar stroke, meningioma, depression and dementia who initially presented to the hospital for evaluation of right lower extremity pain after a mechanical fall. She was diagnosed with a periprosthetic fracture of her right femur. She underwent ORIF of the periprosthetic fracture on Monday. The patient on RN checks overnight appeared to be resting comfortably, but did not awaken for a vital check this morning and Rapid Response was called. The patient did appear to have potential left sided droop with her mouth at  rest, and upon stimulation she would make noise but not open her eyes. CT head revealednew low densities in the right cerebellar hemisphere, right parietal cortex and left occipital lobe concerning for subacute infarctions.The patient was out of the tPA time window at the time of the CT scan which showed the new strokes. She was started on ASA and Lipitor.  1. Exam reveals an obtunded patient with decreased tone in all 4 extremities. Obtundation is most likely secondary to the high stroke burden seen on MRI.  2. MRI brain: Multifocal supratentorial and infratentorial areas of acute/subacute infarcts suggesting embolic event. The largest areas of infarct involve the left occipital temporal region, left  parietal lobe and posterior right temporal lobe. Hemosiderin deposits in the right frontal lobe, bilateral cerebellar and cerebral hemisphere, as well as basal ganglia, right thalamus and pons suggesting prior microhemorrhages. Moderate to advanced chronic microvascular ischemic changes of the white matter. 3. MRA head: No intracranial large vessel occlusion. Findings consistent with intracranial atherosclerotic disease with focal areas of moderate stenosis involving the right A3 and A4/ACA segments and in the mid P2/PCA segment. 4. Carotid ultrasound: Moderate amount of right-sided atherosclerotic plaque results in borderline elevated peak systolic velocities within the right internal carotid artery which approach the 50% luminal narrowing range. Minimal amount of left-sided atherosclerotic plaque, not definitely resulting in a hemodynamically significant stenosis. 5. Stroke Risk Factors - HTN, HLD and prior lacunar stroke 6. Long term neurological outcome is likely to be poor given the patient's advanced age, dementia and high stroke burden seen on MRI.   Recommendations: 1. TTE. If negative will need TEE.  2. Cardiac telemetry.  3. PT consult, OT consult, Speech consult 4. HgbA1c, fasting lipid  panel 5. Risk factor modification 6. Frequent neuro checks 7. Modified permissive HTN protocol until tomorrow AM: Treat SBP if > 180.   8. Prophylactic therapy-Continue ASA for now. Anticoagulation may be indicated if telemetry shows a-fib or if TTE shows mural thrombus or severely decreased EF. However, most likely would need to wait about 7 days due to the high stroke burden seen on MRI, which presents a risk for hemorrhagic conversion. There also is possible right frontal subarachnoid hemorrhage of indeterminate age within one sulcus, which was not mentioned in the Radiology report.  9. Repeat CT head tomorrow at 1100 to assess for possible development of swelling with mass effect at the locations of the ischemic infarctions (ordered).    @Electronically  signed: Dr. Kerney Elbe  01/20/2021, 10:21 AM

## 2021-01-20 NOTE — TOC Progression Note (Signed)
Transition of Care [facility]) - Progression Note    Patient Details  Name: Crystal Burch MRN: [id number] Date of Birth: 1936-01-21  Transition of Care [facility]) CM/SW [place], RN Phone Number: 01/20/2021, 10:37 AM  Clinical Narrative:   Patient going for tests after Rapid Response earlier today.  Will communicate bed offers to family when appropriate.  TOC to follow.    Expected Discharge Plan: [place] Barriers to Discharge: Continued Medical Work up  Expected Discharge Plan and Services Expected Discharge Plan: [place]   Discharge Planning Services: CM Consult Post Acute Care Choice: [place] Living arrangements for the past 2 months: Single Family Home                 DME Arranged:  (n/a)                     Social Determinants of Health (SDOH) Interventions    Readmission Risk Interventions No flowsheet data found.

## 2021-01-20 NOTE — Progress Notes (Signed)
Prayer for patient during rapid.

## 2021-01-20 NOTE — Significant Event (Signed)
Rapid Response Event Note   Reason for Call :  Patient unresponsive   Initial Focused Assessment:  Patient minimally responsive to pain. Patient appears to be guarding airway 0600 O2 96 on RA HR 88 BP 137/80 RR14 0615 96 O2 on RA HR 90 BP 140/56 RR2 0630 98 02 on RA HR 86 BP 132/60 RR 14  0645 98 O2 on RA HR 85 BP 157/68 RR 12 Vitals stable throughout rapid  Pupils 2 and equally reactive.       Interventions:  Vital sign monitoring    Plan of Care:  VBG EKG CT  Event Summary:   MD Notified: Damita Dunnings [address] [id number]  Gonzella Lex, RN

## 2021-01-20 NOTE — Progress Notes (Addendum)
SLP Cancellation Note  Patient Details Name: Crystal Burch MRN: [id number] DOB: 12/20/1935   Cancelled treatment:       Reason Eval/Treat Not Completed: Patient not medically ready;Medical issues which prohibited therapy;Patient's level of consciousness (chart reviewed; consulted NSG; met w/ Son). Per review and consultation w/ NSG, pt is not appropriate for a Cognitive-linguistic evaluation at this time. Pt had a Rapid Response called this morning after being found minimally responsive. MRI revealed "Multifocal supratentorial and infratentorial areas of acute/subacute infarcts suggesting embolic event. The largest areas of infarct involve the left occipital temporal region, left parietal lobe and posterior right temporal lobe". Further work-up is ongoing. Pt remains poorly responsive w/ intermittent moaning.  Will hold on assessments at this time; pt would benefit from a BSE prior to initiation of oral intake or diet being ordered. Recommend frequent oral care for hygiene and stimulation of swallowing. Discussed role of SLP w/ Son and the recommendation to hold on any assessments today. NSG updated and agreed.       , MS, CCC-SLP Speech Language Pathologist Rehab Services [phone] , 01/20/2021, 2:01 PM   

## 2021-01-20 NOTE — Progress Notes (Signed)
Consent for blood obtained from pt's son Tylia Ewell. Mri called and are coming for the pt. Blood will be started post mri

## 2021-01-20 NOTE — Progress Notes (Signed)
Rapid response PROGRESS NOTE    Crystal Burch  PYK:[id number] DOB: 01-Feb-1936 DOA: 01/17/2021 PCP: [place]   Rapid response called on patient who was thought to be sleeping but found minimally responsive to sternal rub  Brief Narrative: Crystal Burch is a 85 y.o. femalewith medical history significant for hypertension, hyperlipidemia, neuropathy, depression, anxiety, dementia, , history of lacunar stroke, history of meningioma  Who underwent ORIF for periprosthetic fracture (right) on 01/18/2021 resulting from a mechanical fall.  On the morning of 5/18, she was found to be unresponsive.  Her baseline is described as being awake and alert albeit with history of dementia.  Nurse describes that patient appeared to be resting comfortably all night but did not awake for vital check this morning and they found her difficult to arouse and rapid response was called.  By my arrival, rapid response team was present.  Vitals were within normal limits, CBG within normal limits  Assessment/plan: Unresponsiveness/acute metabolic encephalopathy - Patient had no recent administration of opiates or sedating medication or pain meds - Physical exam was nonfocal - Blood work done at 5 AM reviewed with findings significant for anemia of 6.8 down from 7.4 the day prior but which will not explain mental status - Orders for stat EKG, ABG and head CT - Further orders to follow pending results  Will sign out to incoming team due to change of shift  CRITICAL CARE Performed by: Athena Masse   Total critical care time: 45 minutes  Critical care time was exclusive of separately billable procedures and treating other patients.  Critical care was necessary to treat or prevent imminent or life-threatening deterioration.  Critical care was time spent personally by me on the following activities: development of treatment plan with patient and/or surrogate as well as nursing, discussions with  consultants, evaluation of patient's response to treatment, examination of patient, obtaining history from patient or surrogate, ordering and performing treatments and interventions, ordering and review of laboratory studies, ordering and review of radiographic studies, pulse oximetry and re-evaluation of patient's condition.  CRITICAL CARE Performed by: Athena Masse   Total critical care time: 35 minutes  Critical care time was exclusive of separately billable procedures and treating other patients.  Critical care was necessary to treat or prevent imminent or life-threatening deterioration.  Critical care was time spent personally by me on the following activities: development of treatment plan with patient and/or surrogate as well as nursing, discussions with consultants, evaluation of patient's response to treatment, examination of patient, obtaining history from patient or surrogate, ordering and performing treatments and interventions, ordering and review of laboratory studies, ordering and review of radiographic studies, pulse oximetry and re-evaluation of patient's condition.  Assessment & Plan:   Principal Problem:   Femur fracture, right (HCC) Active Problems:   Essential hypertension   Hyperlipidemia, mixed   Depression   Memory loss   GERD (gastroesophageal reflux disease)   Hypokalemia    Objective: Vitals:   01/19/21 1608 01/19/21 1941 01/20/21 0027 01/20/21 0520  BP: (!) 102/44 (!) 100/39 (!) 119/50 (!) 142/68  Pulse: 98 96 97 97  Resp: 16 18 16 18   Temp: 99.8 F (37.7 C) 100 F (37.8 C) 97.9 F (36.6 C) 98.6 F (37 C)  TempSrc:  Oral Oral Axillary  SpO2: 94% 91% 95% 97%  Weight:      Height:        Intake/Output Summary (Last 24 hours) at 01/20/2021 5397 Last data filed  at 01/20/2021 0435 Gross per 24 hour  Intake --  Output 800 ml  Net -800 ml   Filed Weights   01/17/21 1121  Weight: 61 kg    Examination:  General exam: Patient not arousing  to deep sternal rub Eyes: Pupils equal and reactive to light and accommodation Respiratory system: Clear to auscultation. Respiratory effort normal. Cardiovascular system: S1 & S2 heard, RRR. No JVD, murmurs, rubs, gallops or clicks. No pedal edema. Gastrointestinal system: Abdomen is nondistended, soft and . No organomegaly or masses felt. Normal bowel sounds heard. Central nervous system: Alert and oriented. No focal neurological deficits. Extremities: Symmetric 5 x 5 power. Skin: No rashes, lesions or ulcers      Data Reviewed: I have personally reviewed following labs and imaging studies  CBC: Recent Labs  Lab 01/17/21 1112 01/18/21 0356 01/19/21 0559 01/20/21 0438  WBC 7.0 6.9 8.8 6.2  NEUTROABS 4.4  --   --  4.0  HGB 12.4 10.0* 7.4* 6.8*  HCT 39.0 30.6* 23.2* 21.0*  MCV 97.5 96.2 98.7 96.8  PLT 122* 126* 118* 123XX123*   Basic Metabolic Panel: Recent Labs  Lab 01/17/21 1112 01/18/21 0356 01/19/21 0559  NA 142 139 138  K 3.3* 4.0 4.0  CL 109 109 107  CO2 24 25 24   GLUCOSE 127* 124* 167*  BUN 21 21 23   CREATININE 0.67 0.65 0.84  CALCIUM 9.0 8.8* 8.5*  MG 2.0  --   --    GFR: Estimated Creatinine Clearance: 42.1 mL/min (by C-G formula based on SCr of 0.84 mg/dL). Liver Function Tests: No results for input(s): AST, ALT, ALKPHOS, BILITOT, PROT, ALBUMIN in the last 168 hours. No results for input(s): LIPASE, AMYLASE in the last 168 hours. No results for input(s): AMMONIA in the last 168 hours. Coagulation Profile: Recent Labs  Lab 01/17/21 1112  INR 1.0   Cardiac Enzymes: No results for input(s): CKTOTAL, CKMB, CKMBINDEX, TROPONINI in the last 168 hours. BNP (last 3 results) No results for input(s): PROBNP in the last 8760 hours. HbA1C: No results for input(s): HGBA1C in the last 72 hours. CBG: Recent Labs  Lab 01/20/21 0545  GLUCAP 108*   Lipid Profile: No results for input(s): CHOL, HDL, LDLCALC, TRIG, CHOLHDL, LDLDIRECT in the last 72 hours. Thyroid  Function Tests: No results for input(s): TSH, T4TOTAL, FREET4, T3FREE, THYROIDAB in the last 72 hours. Anemia Panel: No results for input(s): VITAMINB12, FOLATE, FERRITIN, TIBC, IRON, RETICCTPCT in the last 72 hours. Sepsis Labs: No results for input(s): PROCALCITON, LATICACIDVEN in the last 168 hours.  Recent Results (from the past 240 hour(s))  Resp Panel by RT-PCR (Flu A&B, Covid) Nasopharyngeal Swab     Status: None   Collection Time: 01/17/21 11:12 AM   Specimen: Nasopharyngeal Swab; Nasopharyngeal(NP) swabs in vial transport medium  Result Value Ref Range Status   SARS Coronavirus 2 by RT PCR NEGATIVE NEGATIVE Final    Comment: (NOTE) SARS-CoV-2 target nucleic acids are NOT DETECTED.  The SARS-CoV-2 RNA is generally detectable in upper respiratory specimens during the acute phase of infection. The lowest concentration of SARS-CoV-2 viral copies this assay can detect is 138 copies/mL. A negative result does not preclude SARS-Cov-2 infection and should not be used as the sole basis for treatment or other patient management decisions. A negative result may occur with  improper specimen collection/handling, submission of specimen other than nasopharyngeal swab, presence of viral mutation(s) within the areas targeted by this assay, and inadequate number of viral copies(<138 copies/mL). A  negative result must be combined with clinical observations, patient history, and epidemiological information. The expected result is Negative.  Fact Sheet for Patients:  EntrepreneurPulse.com.au  Fact Sheet for Healthcare Providers:  IncredibleEmployment.be  This test is no t yet approved or cleared by the [place] FDA and  has been authorized for detection and/or diagnosis of SARS-CoV-2 by FDA under an Emergency Use Authorization (EUA). This EUA will remain  in effect (meaning this test can be used) for the duration of the COVID-19 declaration under  Section 564(b)(1) of the Act, 21 U.S.C.section 360bbb-3(b)(1), unless the authorization is terminated  or revoked sooner.       Influenza A by PCR NEGATIVE NEGATIVE Final   Influenza B by PCR NEGATIVE NEGATIVE Final    Comment: (NOTE) The Xpert Xpress SARS-CoV-2/FLU/RSV plus assay is intended as an aid in the diagnosis of influenza from Nasopharyngeal swab specimens and should not be used as a sole basis for treatment. Nasal washings and aspirates are unacceptable for Xpert Xpress SARS-CoV-2/FLU/RSV testing.  Fact Sheet for Patients: EntrepreneurPulse.com.au  Fact Sheet for Healthcare Providers: IncredibleEmployment.be  This test is not yet approved or cleared by the [place] FDA and has been authorized for detection and/or diagnosis of SARS-CoV-2 by FDA under an Emergency Use Authorization (EUA). This EUA will remain in effect (meaning this test can be used) for the duration of the COVID-19 declaration under Section 564(b)(1) of the Act, 21 U.S.C. section 360bbb-3(b)(1), unless the authorization is terminated or revoked.  Performed at [facility], [address], [place], [place] [address]          Radiology Studies: DG C-Arm 1-60 Min  Result Date: 01/18/2021 CLINICAL DATA:  Right femur surgery. EXAM: DG C-ARM 1-60 MIN; RIGHT FEMUR 2 VIEWS FLUOROSCOPY TIME:  Fluoroscopy Time:  2 minutes and 18 seconds Number of Acquired Spot Images: 10 COMPARISON:  Jan 17, 2021. FINDINGS: Ten C-arm fluoroscopic images were obtained intraoperatively and submitted for post operative interpretation. These images demonstrate surgical changes of plate and screw fixation of a periprosthetic fracture with multiple cerclage wires. Final images demonstrate improved alignment, near anatomic. Partially imaged hip and knee arthroplasties. Please see the performing provider's procedural report for further detail. IMPRESSION: Intraoperative fluoroscopy,  as detailed above. Electronically Signed   By: Margaretha Sheffield MD   On: 01/18/2021 18:14   DG C-Arm 1-60 Min  Result Date: 01/18/2021 CLINICAL DATA:  Right femur surgery. EXAM: DG C-ARM 1-60 MIN; RIGHT FEMUR 2 VIEWS FLUOROSCOPY TIME:  Fluoroscopy Time:  2 minutes and 18 seconds Number of Acquired Spot Images: 10 COMPARISON:  Jan 17, 2021. FINDINGS: Ten C-arm fluoroscopic images were obtained intraoperatively and submitted for post operative interpretation. These images demonstrate surgical changes of plate and screw fixation of a periprosthetic fracture with multiple cerclage wires. Final images demonstrate improved alignment, near anatomic. Partially imaged hip and knee arthroplasties. Please see the performing provider's procedural report for further detail. IMPRESSION: Intraoperative fluoroscopy, as detailed above. Electronically Signed   By: Margaretha Sheffield MD   On: 01/18/2021 18:14   DG FEMUR, MIN 2 VIEWS RIGHT  Result Date: 01/18/2021 CLINICAL DATA:  Right femur surgery. EXAM: DG C-ARM 1-60 MIN; RIGHT FEMUR 2 VIEWS FLUOROSCOPY TIME:  Fluoroscopy Time:  2 minutes and 18 seconds Number of Acquired Spot Images: 10 COMPARISON:  Jan 17, 2021. FINDINGS: Ten C-arm fluoroscopic images were obtained intraoperatively and submitted for post operative interpretation. These images demonstrate surgical changes of plate and screw fixation of a periprosthetic fracture  with multiple cerclage wires. Final images demonstrate improved alignment, near anatomic. Partially imaged hip and knee arthroplasties. Please see the performing provider's procedural report for further detail. IMPRESSION: Intraoperative fluoroscopy, as detailed above. Electronically Signed   By: Margaretha Sheffield MD   On: 01/18/2021 18:14   DG FEMUR PORT, MIN 2 VIEWS RIGHT  Result Date: 01/18/2021 CLINICAL DATA:  Status post ORIF of femur fracture. EXAM: RIGHT FEMUR PORTABLE 2 VIEW COMPARISON:  01/17/2021 FINDINGS: Interval open reduction and  internal fixation of the right femur fracture. There has been placement of a lateral sideplate and screw device. The fracture fragments are in near anatomic alignment. No complications. Previous right total hip arthroplasty and right total knee arthroplasty devices are again noted. IMPRESSION: Status post ORIF of right femur fracture. Electronically Signed   By: Kerby Moors M.D.   On: 01/18/2021 17:47        Scheduled Meds: . atorvastatin  40 mg Oral QHS  . Chlorhexidine Gluconate Cloth  6 each Topical Once  . citalopram  10 mg Oral Daily  . docusate sodium  100 mg Oral BID  . donepezil  10 mg Oral QPM  . enoxaparin (LOVENOX) injection  40 mg Subcutaneous Q24H  . feeding supplement  237 mL Oral BID BM  . ferrous [id number] C-folic acid  1 capsule Oral BID  . gabapentin  600 mg Oral Daily  . lisinopril  7.5 mg Oral QHS  . melatonin  2.5 mg Oral QHS  . montelukast  10 mg Oral QHS  . multivitamin with minerals  1 tablet Oral Daily  . oxybutynin  5 mg Oral BID  . pantoprazole  40 mg Oral q AM  . vitamin B-12  1,000 mcg Oral Daily   Continuous Infusions:   LOS: 3 days    Time spent:45    Athena Masse, MD [facility]

## 2021-01-20 NOTE — Progress Notes (Signed)
PT Cancellation Note  Patient Details Name: Crystal Burch MRN: [id number] DOB: 1936-01-29   Cancelled Treatment:    Reason Eval/Treat Not Completed: Other (comment). Family in room, pt with eyes closed throughout visit. Pt lethargic, did not rouse to participate meaningfully with PT. PT to re-attempt as able.  Lieutenant Diego PT, DPT 4:01 PM,01/20/21

## 2021-01-20 NOTE — Progress Notes (Signed)
1 unit of PRBC started on the pt. Son Ronalee Belts in the room at pt bedside. S/s of infusion reaction explained.

## 2021-01-20 NOTE — Progress Notes (Signed)
Follow up from earlier rapid response. Patient appeared to be resting in bed. Vital signs were stable on last check and patient protecting her airway. CT head completed. CBGs normal. Patient did appear to have potential left sided droop with her mouth at rest. Upon stimulation patient would make noise but not open her eyes. Updated 1A charge nurse in person and touched base with patient's primary RN via chat.

## 2021-01-20 NOTE — Progress Notes (Signed)
MD Zhang asked for a FSBS, 105, he was notified. He also stated to hold lovenox this am.

## 2021-01-20 NOTE — Progress Notes (Signed)
   01/20/21 0520  Assess: MEWS Score  Temp 98.6 F (37 C)  BP (!) 142/68  Pulse Rate 97  Resp 18  Level of Consciousness Unresponsive (lethargic and difficult to arouse)  SpO2 97 %  O2 Device Room Air  Patient Activity (if Appropriate) In bed  Assess: MEWS Score  MEWS Temp 0  MEWS Systolic 0  MEWS Pulse 0  MEWS RR 0  MEWS LOC 3  MEWS Score 3  MEWS Score Color Yellow  Assess: if the MEWS score is Yellow or Red  Were vital signs taken at a resting state? Yes  Focused Assessment Change from prior assessment (see assessment flowsheet)  Early Detection of Sepsis Score *See Row Information* Low  MEWS guidelines implemented *See Row Information* Yes  Treat  MEWS Interventions Escalated (See documentation below);Consulted Respiratory Therapy;Other (Comment) (Called RRT and page Dr. Damita Dunnings)  Pain Scale 0-10  Pain Score Asleep  Take Vital Signs  Increase Vital Sign Frequency  Yellow: Q 2hr X 2 then Q 4hr X 2, if remains yellow, continue Q 4hrs  Escalate  MEWS: Escalate Yellow: discuss with charge nurse/RN and consider discussing with provider and RRT  Notify: Charge Nurse/RN  Name of Charge Nurse/RN Notified [place], RN  Date Charge Nurse/RN Notified 01/20/21  Time Charge Nurse/RN Notified 0520  Notify: Provider  Provider Name/Title Dr. Judd Gaudier, MD  Date Provider Notified 01/20/21  Time Provider Notified 0530  Notification Type Page (and secure chat)  Notification Reason Change in status  Provider response See new orders;At bedside  Date of Provider Response 01/20/21  Time of Provider Response 0540  Notify: Rapid Response  Name of Rapid Response RN Notified Alver Fisher, RN  Date Rapid Response Notified 01/20/21  Time Rapid Response Notified 0530  Document  Patient Outcome Not stable and remains on department (will continue to monitor)

## 2021-01-20 NOTE — Progress Notes (Signed)
OT Cancellation Note  Patient Details Name: Crystal Burch MRN: [id number] DOB: June 30, 1936   Cancelled Treatment:    Reason Eval/Treat Not Completed: Patient not medically ready. Orders received and chart review. Per chart review, pt currently receiving RBC d/t low Hb and awaiting neurology consult following RR this AM. OT to evaluate at later time/date as able.  Fredirick Maudlin, OTR/L [place]

## 2021-01-20 NOTE — Progress Notes (Signed)
PROGRESS NOTE    Crystal Burch  ACZ:[id number] DOB: Dec 18, 1935 DOA: 01/17/2021 PCP: [place]   Chief complaint altered mental Status Brief Narrative:  Crystal Burch is a 85 y.o. femalewith medical history significant for hypertension, hyperlipidemia, neuropathy, depression, anxiety, dementia, Charcot-Marie-Tooth disease, s/p right total knee replacement in July 2016, s/p right total hip replacement in August 2013 with compression in 2017, lateral [name], history of lacunar stroke, history of meningioma. She presented to the hospital because of right lower extremity pain after a mechanical fall.  She was found to have right periprosthetic fracture of the femur. She was treated with analgesics. Orthopedic surgeon was consulted to assist with management.  She underwent ORIF periprosthetic fracture (right) on 01/18/2021.  5/18.  Patient has become unresponsive essentially yesterday evening, CT scan showed new low densities in the right cerebellar hemisphere, right parietal cortex and left occipital lobe concerning for acute infarction.    Assessment & Plan:   Principal Problem:   Femur fracture, right (HCC) Active Problems:   Essential hypertension   Hyperlipidemia, mixed   Depression   Memory loss   GERD (gastroesophageal reflux disease)   Hypokalemia  #1.  Acute right-sided ischemic stroke. Altered mental status secondary to stroke. Need to rule out embolic stroke, spoke with patient and son, patient does not have atrial fibrillation in history. Will obtain stroke work-up, including carotid ultrasound, MRI of the brain, echocardiogram.  We also placed on telemetry to rule out atrial fibrillation.  Consult neurology.  Check lipid panel, start Lipitor and aspirin. Patient is not a thrombolytic candidate, her symptoms were first noticed yesterday evening. Patient currently is unsafe to eat due to altered mental status, will start fluids for gentle  rehydration.   #2.  Right periprosthetic fracture of the femur. Frequent falls. Patient is status post ORIF on 5/16. Will continue PT/OT.  3.  Acute blood loss anemia. Hemoglobin 6.8.  Will obtain blood transfusion.  #4.  Dementia. Continue Aricept.  5.  Chronic thrombocytopenia. Platelet count stable.   DVT prophylaxis: Lovenox Code Status: Full Family Communication: Son updated Disposition Plan:  .   Status is: Inpatient  Remains inpatient appropriate because:Inpatient level of care appropriate due to severity of illness   Dispo: The patient is from: Home              Anticipated d/c is to: SNF              Patient currently is not medically stable to d/c.   Difficult to place patient No        I/O last 3 completed shifts: In: -  Out: 1325 [Urine:1150; Drains:175] No intake/output data recorded.     Consultants:   Orhtopedics, neurology  Procedures: ORIF of the hip  Antimicrobials: None  Subjective: Spoke with patient's son, RN, Dr. Damita Dunnings from night coverage.  Patient became unresponsive yesterday evening, she just moaning, but does not talk or open eyes.  Rapid response was called this morning, glucose was 105, ABG was normal.  But CT scan of the head showed new stroke. When I see the patient this morning, patient still not responsive, her eyes deviated to the right side. She does not appear to have any short of breath.  No fever or chills. She does not have any nausea vomiting.   Objective: Vitals:   01/19/21 1941 01/20/21 0027 01/20/21 0520 01/20/21 0726  BP: (!) 100/39 (!) 119/50 (!) 142/68 (!) 157/60  Pulse: 96 97 97 90  Resp: 18 16 18 14   Temp: 100 F (37.8 C) 97.9 F (36.6 C) 98.6 F (37 C) 99.4 F (37.4 C)  TempSrc: Oral Oral Axillary Oral  SpO2: 91% 95% 97% 100%  Weight:      Height:        Intake/Output Summary (Last 24 hours) at 01/20/2021 0954 Last data filed at 01/20/2021 0830 Gross per 24 hour  Intake 0 ml  Output 825 ml   Net -825 ml   Filed Weights   01/17/21 1121  Weight: 61 kg    Examination:  General exam: Patient is unresponsive to verbal command. Respiratory system: Clear to auscultation. Respiratory effort normal. Cardiovascular system: S1 & S2 heard, RRR. No JVD, murmurs, rubs, gallops or clicks. No pedal edema. Gastrointestinal system: Abdomen is nondistended, soft and nontender. No organomegaly or masses felt. Normal bowel sounds heard. Central nervous system: Unresponsive, eyes deviated to the right side. Extremities: Symmetric. Skin: No rashes, lesions or ulcers .     Data Reviewed: I have personally reviewed following labs and imaging studies  CBC: Recent Labs  Lab 01/17/21 1112 01/18/21 0356 01/19/21 0559 01/20/21 0438  WBC 7.0 6.9 8.8 6.2  NEUTROABS 4.4  --   --  4.0  HGB 12.4 10.0* 7.4* 6.8*  HCT 39.0 30.6* 23.2* 21.0*  MCV 97.5 96.2 98.7 96.8  PLT 122* 126* 118* 123XX123*   Basic Metabolic Panel: Recent Labs  Lab 01/17/21 1112 01/18/21 0356 01/19/21 0559  NA 142 139 138  K 3.3* 4.0 4.0  CL 109 109 107  CO2 24 25 24   GLUCOSE 127* 124* 167*  BUN 21 21 23   CREATININE 0.67 0.65 0.84  CALCIUM 9.0 8.8* 8.5*  MG 2.0  --   --    GFR: Estimated Creatinine Clearance: 42.1 mL/min (by C-G formula based on SCr of 0.84 mg/dL). Liver Function Tests: No results for input(s): AST, ALT, ALKPHOS, BILITOT, PROT, ALBUMIN in the last 168 hours. No results for input(s): LIPASE, AMYLASE in the last 168 hours. No results for input(s): AMMONIA in the last 168 hours. Coagulation Profile: Recent Labs  Lab 01/17/21 1112  INR 1.0   Cardiac Enzymes: No results for input(s): CKTOTAL, CKMB, CKMBINDEX, TROPONINI in the last 168 hours. BNP (last 3 results) No results for input(s): PROBNP in the last 8760 hours. HbA1C: No results for input(s): HGBA1C in the last 72 hours. CBG: Recent Labs  Lab 01/20/21 0545 01/20/21 0836  GLUCAP 108* 105*   Lipid Profile: No results for  input(s): CHOL, HDL, LDLCALC, TRIG, CHOLHDL, LDLDIRECT in the last 72 hours. Thyroid Function Tests: No results for input(s): TSH, T4TOTAL, FREET4, T3FREE, THYROIDAB in the last 72 hours. Anemia Panel: No results for input(s): VITAMINB12, FOLATE, FERRITIN, TIBC, IRON, RETICCTPCT in the last 72 hours. Sepsis Labs: No results for input(s): PROCALCITON, LATICACIDVEN in the last 168 hours.  Recent Results (from the past 240 hour(s))  Resp Panel by RT-PCR (Flu A&B, Covid) Nasopharyngeal Swab     Status: None   Collection Time: 01/17/21 11:12 AM   Specimen: Nasopharyngeal Swab; Nasopharyngeal(NP) swabs in vial transport medium  Result Value Ref Range Status   SARS Coronavirus 2 by RT PCR NEGATIVE NEGATIVE Final    Comment: (NOTE) SARS-CoV-2 target nucleic acids are NOT DETECTED.  The SARS-CoV-2 RNA is generally detectable in upper respiratory specimens during the acute phase of infection. The lowest concentration of SARS-CoV-2 viral copies this assay can detect is 138 copies/mL. A negative result does not preclude SARS-Cov-2 infection and  should not be used as the sole basis for treatment or other patient management decisions. A negative result may occur with  improper specimen collection/handling, submission of specimen other than nasopharyngeal swab, presence of viral mutation(s) within the areas targeted by this assay, and inadequate number of viral copies(<138 copies/mL). A negative result must be combined with clinical observations, patient history, and epidemiological information. The expected result is Negative.  Fact Sheet for Patients:  EntrepreneurPulse.com.au  Fact Sheet for Healthcare Providers:  IncredibleEmployment.be  This test is no t yet approved or cleared by the [place] FDA and  has been authorized for detection and/or diagnosis of SARS-CoV-2 by FDA under an Emergency Use Authorization (EUA). This EUA will remain  in effect  (meaning this test can be used) for the duration of the COVID-19 declaration under Section 564(b)(1) of the Act, 21 U.S.C.section 360bbb-3(b)(1), unless the authorization is terminated  or revoked sooner.       Influenza A by PCR NEGATIVE NEGATIVE Final   Influenza B by PCR NEGATIVE NEGATIVE Final    Comment: (NOTE) The Xpert Xpress SARS-CoV-2/FLU/RSV plus assay is intended as an aid in the diagnosis of influenza from Nasopharyngeal swab specimens and should not be used as a sole basis for treatment. Nasal washings and aspirates are unacceptable for Xpert Xpress SARS-CoV-2/FLU/RSV testing.  Fact Sheet for Patients: EntrepreneurPulse.com.au  Fact Sheet for Healthcare Providers: IncredibleEmployment.be  This test is not yet approved or cleared by the [place] FDA and has been authorized for detection and/or diagnosis of SARS-CoV-2 by FDA under an Emergency Use Authorization (EUA). This EUA will remain in effect (meaning this test can be used) for the duration of the COVID-19 declaration under Section 564(b)(1) of the Act, 21 U.S.C. section 360bbb-3(b)(1), unless the authorization is terminated or revoked.  Performed at [facility], [address] [place], [place] [address]          Radiology Studies: CT HEAD WO CONTRAST  Result Date: 01/20/2021 CLINICAL DATA:  Altered mental status. EXAM: CT HEAD WITHOUT CONTRAST TECHNIQUE: Contiguous axial images were obtained from the base of the skull through the vertex without intravenous contrast. COMPARISON:  April 13, 2019. FINDINGS: Brain: Mild chronic ischemic white matter disease is noted. New low densities are noted in the right cerebellar hemisphere and right parietal cortex as well as left occipital lobe. These are concerning for acute infarction, and MRI is recommended for further evaluation. Ventricular size is within normal limits. No midline shift is noted. No hemorrhage is  noted. Vascular: No hyperdense vessel or unexpected calcification. Skull: Normal. Negative for fracture or focal lesion. Sinuses/Orbits: No acute finding. Other: None. IMPRESSION: New low densities are noted in the right cerebellar hemisphere, right parietal cortex and left occipital lobe concerning for acute infarction. MRI is recommended for further evaluation. Electronically Signed   By: Marijo Conception M.D.   On: 01/20/2021 08:51   DG C-Arm 1-60 Min  Result Date: 01/18/2021 CLINICAL DATA:  Right femur surgery. EXAM: DG C-ARM 1-60 MIN; RIGHT FEMUR 2 VIEWS FLUOROSCOPY TIME:  Fluoroscopy Time:  2 minutes and 18 seconds Number of Acquired Spot Images: 10 COMPARISON:  Jan 17, 2021. FINDINGS: Ten C-arm fluoroscopic images were obtained intraoperatively and submitted for post operative interpretation. These images demonstrate surgical changes of plate and screw fixation of a periprosthetic fracture with multiple cerclage wires. Final images demonstrate improved alignment, near anatomic. Partially imaged hip and knee arthroplasties. Please see the performing provider's procedural report for further detail. IMPRESSION: Intraoperative  fluoroscopy, as detailed above. Electronically Signed   By: Margaretha Sheffield MD   On: 01/18/2021 18:14   DG C-Arm 1-60 Min  Result Date: 01/18/2021 CLINICAL DATA:  Right femur surgery. EXAM: DG C-ARM 1-60 MIN; RIGHT FEMUR 2 VIEWS FLUOROSCOPY TIME:  Fluoroscopy Time:  2 minutes and 18 seconds Number of Acquired Spot Images: 10 COMPARISON:  Jan 17, 2021. FINDINGS: Ten C-arm fluoroscopic images were obtained intraoperatively and submitted for post operative interpretation. These images demonstrate surgical changes of plate and screw fixation of a periprosthetic fracture with multiple cerclage wires. Final images demonstrate improved alignment, near anatomic. Partially imaged hip and knee arthroplasties. Please see the performing provider's procedural report for further detail.  IMPRESSION: Intraoperative fluoroscopy, as detailed above. Electronically Signed   By: Margaretha Sheffield MD   On: 01/18/2021 18:14   DG FEMUR, MIN 2 VIEWS RIGHT  Result Date: 01/18/2021 CLINICAL DATA:  Right femur surgery. EXAM: DG C-ARM 1-60 MIN; RIGHT FEMUR 2 VIEWS FLUOROSCOPY TIME:  Fluoroscopy Time:  2 minutes and 18 seconds Number of Acquired Spot Images: 10 COMPARISON:  Jan 17, 2021. FINDINGS: Ten C-arm fluoroscopic images were obtained intraoperatively and submitted for post operative interpretation. These images demonstrate surgical changes of plate and screw fixation of a periprosthetic fracture with multiple cerclage wires. Final images demonstrate improved alignment, near anatomic. Partially imaged hip and knee arthroplasties. Please see the performing provider's procedural report for further detail. IMPRESSION: Intraoperative fluoroscopy, as detailed above. Electronically Signed   By: Margaretha Sheffield MD   On: 01/18/2021 18:14   DG FEMUR PORT, MIN 2 VIEWS RIGHT  Result Date: 01/18/2021 CLINICAL DATA:  Status post ORIF of femur fracture. EXAM: RIGHT FEMUR PORTABLE 2 VIEW COMPARISON:  01/17/2021 FINDINGS: Interval open reduction and internal fixation of the right femur fracture. There has been placement of a lateral sideplate and screw device. The fracture fragments are in near anatomic alignment. No complications. Previous right total hip arthroplasty and right total knee arthroplasty devices are again noted. IMPRESSION: Status post ORIF of right femur fracture. Electronically Signed   By: Kerby Moors M.D.   On: 01/18/2021 17:47        Scheduled Meds: .  stroke: mapping our early stages of recovery book   Does not apply Once  . sodium chloride   Intravenous Once  . aspirin  300 mg Rectal Daily   Or  . aspirin  325 mg Oral Daily  . atorvastatin  40 mg Oral QHS  . Chlorhexidine Gluconate Cloth  6 each Topical Once  . citalopram  10 mg Oral Daily  . docusate sodium  100 mg Oral  BID  . donepezil  10 mg Oral QPM  . enoxaparin (LOVENOX) injection  40 mg Subcutaneous Q24H  . feeding supplement  237 mL Oral BID BM  . ferrous [id number] C-folic acid  1 capsule Oral BID  . gabapentin  600 mg Oral Daily  . lisinopril  7.5 mg Oral QHS  . melatonin  2.5 mg Oral QHS  . montelukast  10 mg Oral QHS  . multivitamin with minerals  1 tablet Oral Daily  . oxybutynin  5 mg Oral BID  . pantoprazole  40 mg Oral q AM  . vitamin B-12  1,000 mcg Oral Daily   Continuous Infusions:   LOS: 3 days    Time spent: 32 minutes    Sharen Hones, MD [facility]   To contact the attending provider between 7A-7P or the covering provider during after hours  7P-7A, please log into the web site [email or web address] and access using universal [place] password for that web site. If you do not have the password, please call the hospital operator.  01/20/2021, 9:54 AM

## 2021-01-20 NOTE — Progress Notes (Signed)
PT Cancellation Note  Patient Details Name: Crystal Burch MRN: [id number] DOB: 05/04/1936   Cancelled Treatment:    Reason Eval/Treat Not Completed: Other (comment);Medical issues which prohibited therapy. Per chart pt pending further medical workup at this time due to a change in medical status. PT to re-attempt for re-evaluation when patient is medically appropriate.    Lieutenant Diego PT, DPT 10:39 AM,01/20/21

## 2021-01-20 NOTE — Progress Notes (Signed)
   Subjective: 2 Days Post-Op Procedure(s) (LRB): OPEN REDUCTION INTERNAL FIXATION (ORIF) PERIPROSTHETIC FRACTURE (Right) Patient sleeping. Unable to arouse.  Rapid response called. Stat testing ordered   Plan is to go Skilled nursing facility after hospital stay.  Objective: Vital signs in last 24 hours: Temp:  [97.9 F (36.6 C)-100 F (37.8 C)] 99.4 F (37.4 C) (05/18 0726) Pulse Rate:  [90-98] 90 (05/18 0726) Resp:  [14-18] 14 (05/18 0726) BP: (100-157)/(39-68) 157/60 (05/18 0726) SpO2:  [91 %-100 %] 100 % (05/18 0726) FiO2 (%):  [0 %] 0 % (05/18 0027)  Intake/Output from previous day: 05/17 0701 - 05/18 0700 In: -  Out: 825 [Urine:800; Drains:25] Intake/Output this shift: No intake/output data recorded.  Recent Labs    01/17/21 1112 01/18/21 0356 01/19/21 0559 01/20/21 0438  HGB 12.4 10.0* 7.4* 6.8*   Recent Labs    01/19/21 0559 01/20/21 0438  WBC 8.8 6.2  RBC 2.35* 2.17*  HCT 23.2* 21.0*  PLT 118* 117*   Recent Labs    01/18/21 0356 01/19/21 0559  NA 139 138  K 4.0 4.0  CL 109 107  CO2 25 24  BUN 21 23  CREATININE 0.65 0.84  GLUCOSE 124* 167*  CALCIUM 8.8* 8.5*   Recent Labs    01/17/21 1112  INR 1.0    EXAM General - Patient is sleeping.  Extremity - No cellulitis present Compartment soft Dressing - dressing C/D/I, provena intact with 300 cc drainage Motor Function - intact, moving foot and toes well on exam  Past Medical History:  Diagnosis Date  . Dementia ([place])   . Hypertension     Assessment/Plan:   2 Days Post-Op Procedure(s) (LRB): OPEN REDUCTION INTERNAL FIXATION (ORIF) PERIPROSTHETIC FRACTURE (Right) Principal Problem:   Femur fracture, right (HCC) Active Problems:   Essential hypertension   Hyperlipidemia, mixed   Depression   Memory loss   GERD (gastroesophageal reflux disease)   Hypokalemia  Estimated body mass index is 26.26 kg/m as calculated from the following:   Height as of this encounter: 5' (1.524  m).   Weight as of this encounter: 61 kg. Advance diet Up with therapy, WBAT RLE VSS Acute post op blood loss anemia - Hgb 6.8, continue iron supplement. Transfuse 1 unit of PRBC Avoid sedating medications CM to assist with discharge to SNF   DVT Prophylaxis - Lovenox, TED hose and SCDs Weight-Bearing as tolerated to right leg   T. Rachelle Hora, PA-C [place] 01/20/2021, 8:15 AM

## 2021-01-20 NOTE — Progress Notes (Signed)
   01/20/21 1004  Clinical Encounter Type  Visited With Patient  Visit Type Initial  Referral From Chaplain  Consult/Referral To Chaplain  Spiritual Encounters  Spiritual Needs Prayer;Emotional  While rounding I visited room 1A-131A, Pt Crystal Burch. Pt was resting but I said a silently prayer by her bedside. Will FU another time.

## 2021-01-21 ENCOUNTER — Inpatient Hospital Stay: Payer: Medicare (Managed Care)

## 2021-01-21 ENCOUNTER — Encounter: Payer: Self-pay | Admitting: Orthopedic Surgery

## 2021-01-21 ENCOUNTER — Inpatient Hospital Stay (HOSPITAL_COMMUNITY)
Admit: 2021-01-21 | Discharge: 2021-01-21 | Disposition: A | Discharge status: Home / Self Care | Payer: Medicare (Managed Care) | Attending: Internal Medicine | Admitting: Internal Medicine

## 2021-01-21 DIAGNOSIS — I10 Essential (primary) hypertension: Secondary | ICD-10-CM | POA: Diagnosis not present

## 2021-01-21 DIAGNOSIS — I6389 Other cerebral infarction: Secondary | ICD-10-CM | POA: Diagnosis not present

## 2021-01-21 DIAGNOSIS — S72491D Other fracture of lower end of right femur, subsequent encounter for closed fracture with routine healing: Secondary | ICD-10-CM | POA: Diagnosis not present

## 2021-01-21 DIAGNOSIS — I639 Cerebral infarction, unspecified: Secondary | ICD-10-CM | POA: Diagnosis not present

## 2021-01-21 LAB — CBC WITH DIFFERENTIAL/PLATELET
Abs Immature Granulocytes: 0.08 10*3/uL — ABNORMAL HIGH (ref 0.00–0.07)
Basophils Absolute: 0 10*3/uL (ref 0.0–0.1)
Basophils Relative: 0 %
Eosinophils Absolute: 0 10*3/uL (ref 0.0–0.5)
Eosinophils Relative: 0 %
HCT: 28.4 % — ABNORMAL LOW (ref 36.0–46.0)
Hemoglobin: 9.5 g/dL — ABNORMAL LOW (ref 12.0–15.0)
Immature Granulocytes: 1 %
Lymphocytes Relative: 13 %
Lymphs Abs: 1 10*3/uL (ref 0.7–4.0)
MCH: 30.9 pg (ref 26.0–34.0)
MCHC: 33.5 g/dL (ref 30.0–36.0)
MCV: 92.5 fL (ref 80.0–100.0)
Monocytes Absolute: 0.6 10*3/uL (ref 0.1–1.0)
Monocytes Relative: 9 %
Neutro Abs: 5.5 10*3/uL (ref 1.7–7.7)
Neutrophils Relative %: 77 %
Platelets: 123 10*3/uL — ABNORMAL LOW (ref 150–400)
RBC: 3.07 MIL/uL — ABNORMAL LOW (ref 3.87–5.11)
RDW: 16.1 % — ABNORMAL HIGH (ref 11.5–15.5)
WBC: 7.2 10*3/uL (ref 4.0–10.5)
nRBC: 0.3 % — ABNORMAL HIGH (ref 0.0–0.2)

## 2021-01-21 LAB — BASIC METABOLIC PANEL
Anion gap: 8 (ref 5–15)
BUN: 17 mg/dL (ref 8–23)
CO2: 22 mmol/L (ref 22–32)
Calcium: 8.1 mg/dL — ABNORMAL LOW (ref 8.9–10.3)
Chloride: 109 mmol/L (ref 98–111)
Creatinine, Ser: 0.52 mg/dL (ref 0.44–1.00)
GFR, Estimated: >60 mL/min (ref >60)
Glucose, Bld: 111 mg/dL — ABNORMAL HIGH (ref 70–99)
Potassium: 3.3 mmol/L — ABNORMAL LOW (ref 3.5–5.1)
Sodium: 139 mmol/L (ref 135–145)

## 2021-01-21 LAB — BPAM RBC
Blood Product Expiration Date: 202206142359
ISSUE DATE / TIME: 202205181300
Unit Type and Rh: 600

## 2021-01-21 LAB — ECHOCARDIOGRAM COMPLETE
AR max vel: 2.52 cm2
AV Area VTI: 2.6 cm2
AV Area mean vel: 2.56 cm2
AV Mean grad: 4 mmHg
AV Peak grad: 8.1 mmHg
Ao pk vel: 1.42 m/s
Area-P 1/2: 3.07 cm2
Height: 60 in
S' Lateral: 2.25 cm
Weight: 2151.69 oz

## 2021-01-21 LAB — LIPID PANEL
Cholesterol: 133 mg/dL (ref 0–200)
HDL: 63 mg/dL (ref >40)
LDL Cholesterol: 58 mg/dL (ref 0–99)
Total CHOL/HDL Ratio: 2.1 RATIO
Triglycerides: 60 mg/dL (ref <150)
VLDL: 12 mg/dL (ref 0–40)

## 2021-01-21 LAB — TYPE AND SCREEN
ABO/RH(D): A NEG
Antibody Screen: NEGATIVE
Unit division: 0

## 2021-01-21 LAB — HEMOGLOBIN A1C
Hgb A1c MFr Bld: 5.6 % (ref 4.8–5.6)
Mean Plasma Glucose: 114.02 mg/dL

## 2021-01-21 LAB — MAGNESIUM: Magnesium: 2 mg/dL (ref 1.7–2.4)

## 2021-01-21 MED ORDER — POTASSIUM CHLORIDE 10 MEQ/100ML IV SOLN
10.0000 meq | INTRAVENOUS | Status: AC
  Administered 2021-01-21 (×2): 10 meq via INTRAVENOUS
  Filled 2021-01-21 (×2): qty 100

## 2021-01-21 NOTE — Progress Notes (Signed)
*  PRELIMINARY RESULTS* Echocardiogram 2D Echocardiogram has been performed.  Sherrie Sport 01/21/2021, 11:08 AM

## 2021-01-21 NOTE — Progress Notes (Signed)
This morning patient only responds to deep sternal rub causing a moan. Frequent yawning. Some edema L hand without pitting. Unable to follow any commands. Left facial droop without change. Son, Ronalee Belts, called and updated. Sister called and informed her she would need to reach out to [place] for an update. Lovenox held as well as all PO meds. Repositioning every 2 hours, now laying on L side. Will continue to monitor.

## 2021-01-21 NOTE — TOC Progression Note (Signed)
Transition of Care [facility]) - Progression Note    Patient Details  Name: Crystal Burch MRN: [id number] Date of Birth: 1936/07/11  Transition of Care [facility]) CM/SW [place], RN Phone Number: 01/21/2021, 9:50 AM  Clinical Narrative:   Spoke to [facility] NP Rande [phone]) regarding patient condition, received bed offer from [facility], [facility] stated that they will follow up with [facility], as status is pending on the [facility] at present and call TOC back.  [facility] NP asked that hospitalist contact her, message sent via secure chat.  TOC contact informtion given to [facility].  TOC will follow through discharge.      Expected Discharge Plan: [place] Barriers to Discharge: Continued Medical Work up  Expected Discharge Plan and Services Expected Discharge Plan: [place]   Discharge Planning Services: CM Consult Post Acute Care Choice: [place] Living arrangements for the past 2 months: Single Family Home                 DME Arranged:  (n/a)                     Social Determinants of Health (SDOH) Interventions    Readmission Risk Interventions No flowsheet data found.

## 2021-01-21 NOTE — Progress Notes (Signed)
OT Cancellation Note  Patient Details Name: Crystal Burch MRN: [id number] DOB: 1936/07/14   Cancelled Treatment:    Reason Eval/Treat Not Completed: Medical issues which prohibited therapy. At this time OT to sign off due to pt's limited ability to participate with rehab services. Please re-consult when patient is medically appropriate for OT re-evaluation as able.   Hanley Hays, MPH, MS, OTR/L ascom [phone] 01/21/21, 12:23 PM

## 2021-01-21 NOTE — Progress Notes (Signed)
SLP Cancellation Note  Patient Details Name: Crystal Burch MRN: [id number] DOB: 1936/08/16   Cancelled treatment:       Reason Eval/Treat Not Completed: Patient not medically ready;Medical issues which prohibited therapy;Patient's level of consciousness (chart reviewed). Reviewed chart; consulted NSG. Pt continues to only respond to deep sternal rub which causes a moan, per NSG. Frequent yawning. Unable to follow any commands. Left facial droop without change.  ST services will hold on BSE and Cog-linguistic evals at this time d/t pt's poor responsiveness, engagement. Recommend frequent oral care for hygiene and stimulation of swallowing; aspiration precautions during. Recommend strict NPO status. ST services will continue to follow pt's status while admitted.      Orinda Kenner, MS, CCC-SLP Speech Language Pathologist Rehab Services [phone] [facility] 01/21/2021, 10:31 AM

## 2021-01-21 NOTE — Progress Notes (Signed)
Pt transfered from 1A, no family at bedside at this time. Pt not responding to sternal rub at this moment, appears asleep. IVFs infusing and tele monitor in place.

## 2021-01-21 NOTE — Progress Notes (Signed)
PROGRESS NOTE    Crystal Burch  XHB:[id number] DOB: 25-Apr-1936 DOA: 01/17/2021 PCP: [facility]   Follow-up on altered mental status Brief Narrative:   Crystal Burch a 85 y.o.femalewith medical history significant for hypertension, hyperlipidemia, neuropathy, depression, anxiety, dementia, Charcot-Marie-Tooth disease, s/p right total knee replacement in July 2016, s/p right total hip replacement in August 2013 with compression in 2017, lateral Crystal Burch, history of lacunar stroke, history of meningioma. She presented to the hospital because of right lower extremity pain after a mechanical fall.  She was found to have right periprosthetic fracture of the femur. She was treated with analgesics. Orthopedic surgeon was consulted to assist with management.She underwent ORIF periprosthetic fracture (right) on 01/18/2021.  5/18.  Patient has become unresponsive essentially yesterday evening, CT scan showed new low densities in the right cerebellar hemisphere, right parietal cortex and left occipital lobe concerning for acute infarction.    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  Acute right side strokes. Encephalopathy secondary to stroke. Patient mental status not improving, her eye deviated to the right side.  She is still unresponsive.  Given her extensive stroke in the right brain, not sure about the patient prognosis.  Discussed case with Dr. Cheral Marker, who will see the patient again today. Per neurology, patient also had chronic new bleeding, but okay to give aspirin, will hold off Lovenox. Patient still has not been able to take any p.o. due to altered mental status.  Continue IV fluids.   May consider NG tube if patient condition is unlikely improve in the short-term.  Discussed with patient son, will consider tube feeding on Monday if she does not improve.  #2.  Right periprosthetic fracture of the femur. Mechanical fall. Status post ORIF 5/16.  3.  Acute blood loss anemia. Received blood transfusion yesterday, hemoglobin is better.  4.  Dementia  5.  Chronic thrombocytopenia     DVT prophylaxis: SCDS Code Status: Full Family Communication: son updated Disposition Plan:  .   Status is: Inpatient  Remains inpatient appropriate because:Inpatient level of care appropriate due to severity of illness   Dispo: The patient is from: Home              Anticipated d/c is to: Home              Patient currently is not medically stable to d/c.   Difficult to place patient No        I/O last 3 completed shifts: In: 406.7 [I.V.:40; Blood:366.7] Out: 3225 [Urine:3200; Drains:25] Total I/O In: -  Out: 24 [Urine:700; Drains:250]     Consultants:   neurologist  Procedures: ORIF  Antimicrobials:   Subjective: Spoke with the nurse, patient has not been responding overnight and this morning.  She only moans to painful stimuli. Not have hypoxia, she does not seem to have any short of breath. He does not seem to be in pain. No nausea vomiting.  No fever chills.  Objective: Vitals:   01/21/21 0032 01/21/21 0412 01/21/21 0745 01/21/21 1014  BP: (!) 176/69 (!) 173/86 (!) 172/66 (!) 163/99  Pulse: 77 81 94 96  Resp: 16 20 18 16   Temp: 99.1 F (37.3 C) 98.4 F (36.9 C)  99.7 F (37.6 C)   TempSrc: Oral Oral    SpO2: 97% 99% 97% 98%  Weight:      Height:        Intake/Output Summary (Last 24 hours) at 01/21/2021 1118 Last data filed at 01/21/2021 0952 Gross per 24 hour  Intake 406.67 ml  Output 3350 ml  Net -2943.33 ml   Filed Weights   01/17/21 1121  Weight: 61 kg    Examination:  General exam: Appears calm and comfortable  Respiratory  system: Clear to auscultation. Respiratory effort normal. Cardiovascular system: S1 & S2 heard, RRR. No JVD, murmurs, rubs, gallops or clicks. No pedal edema. Gastrointestinal system: Abdomen is nondistended, soft and nontender. No organomegaly or masses felt. Normal bowel sounds heard. Central nervous system: Unresponsive.  Both eyes to be to the right side. Extremities: Symmetric 5 x 5 power. Skin: No rashes, lesions or ulcers .     Data Reviewed: I have personally reviewed following labs and imaging studies  CBC: Recent Labs  Lab 01/17/21 1112 01/18/21 0356 01/19/21 0559 01/20/21 0438 01/20/21 1623 01/21/21 0431  WBC 7.0 6.9 8.8 6.2  --  7.2  NEUTROABS 4.4  --   --  4.0  --  5.5  HGB 12.4 10.0* 7.4* 6.8* 9.3* 9.5*  HCT 39.0 30.6* 23.2* 21.0* 28.0* 28.4*  MCV 97.5 96.2 98.7 96.8  --  92.5  PLT 122* 126* 118* 117*  --  831*   Basic Metabolic Panel: Recent Labs  Lab 01/17/21 1112 01/18/21 0356 01/19/21 0559 01/21/21 0431  NA 142 139 138 139  K 3.3* 4.0 4.0 3.3*  CL 109 109 107 109  CO2 24 25 24 22   GLUCOSE 127* 124* 167* 111*  BUN 21 21 23 17   CREATININE 0.67 0.65 0.84 0.52  CALCIUM 9.0 8.8* 8.5* 8.1*  MG 2.0  --   --  2.0   GFR: Estimated Creatinine Clearance: 44.3 mL/min (by C-G formula based on SCr of 0.52 mg/dL). Liver Function Tests: No results for input(s): AST, ALT, ALKPHOS, BILITOT, PROT, ALBUMIN in the last 168 hours. No results for input(s): LIPASE, AMYLASE in the last 168 hours. No results for input(s): AMMONIA in the last 168 hours. Coagulation Profile: Recent Labs  Lab 01/17/21 1112  INR 1.0   Cardiac Enzymes: No results for input(s): CKTOTAL, CKMB, CKMBINDEX, TROPONINI in the last 168 hours. BNP (last 3 results) No results for input(s): PROBNP in the last 8760 hours. HbA1C: Recent Labs    01/21/21 0431  HGBA1C 5.6   CBG: Recent Labs  Lab 01/20/21 0545 01/20/21 0836  GLUCAP 108* 105*   Lipid Profile: Recent Labs     01/21/21 0431  CHOL 133  HDL 63  LDLCALC 58  TRIG 60  CHOLHDL 2.1   Thyroid Function Tests: No results for input(s): TSH, T4TOTAL, FREET4, T3FREE, THYROIDAB in the last 72 hours. Anemia Panel: No results for input(s): VITAMINB12, FOLATE, FERRITIN, TIBC, IRON, RETICCTPCT in the last 72 hours. Sepsis Labs: No results for input(s): PROCALCITON, LATICACIDVEN in the last 168 hours.  Recent Results (from the past 240 hour(s))  Resp Panel by RT-PCR (Flu A&B, Covid) Nasopharyngeal Swab     Status: None   Collection Time: 01/17/21 11:12 AM   Specimen: Nasopharyngeal Swab; Nasopharyngeal(NP) swabs in vial transport medium  Result Value Ref Range Status   SARS Coronavirus 2 by RT PCR NEGATIVE NEGATIVE Final    Comment: (NOTE) SARS-CoV-2 target nucleic acids are NOT DETECTED.  The SARS-CoV-2 RNA is generally  detectable in upper respiratory specimens during the acute phase of infection. The lowest concentration of SARS-CoV-2 viral copies this assay can detect is 138 copies/mL. A negative result does not preclude SARS-Cov-2 infection and should not be used as the sole basis for treatment or other patient management decisions. A negative result may occur with  improper specimen collection/handling, submission of specimen other than nasopharyngeal swab, presence of viral mutation(s) within the areas targeted by this assay, and inadequate number of viral copies(<138 copies/mL). A negative result must be combined with clinical observations, patient history, and epidemiological information. The expected result is Negative.  Fact Sheet for Patients:  EntrepreneurPulse.com.au  Fact Sheet for Healthcare Providers:  IncredibleEmployment.be  This test is no t yet approved or cleared by the [place] FDA and  has been authorized for detection and/or diagnosis of SARS-CoV-2 by FDA under an Emergency Use Authorization (EUA). This EUA will remain  in effect  (meaning this test can be used) for the duration of the COVID-19 declaration under Section 564(b)(1) of the Act, 21 U.S.C.section 360bbb-3(b)(1), unless the authorization is terminated  or revoked sooner.       Influenza A by PCR NEGATIVE NEGATIVE Final   Influenza B by PCR NEGATIVE NEGATIVE Final    Comment: (NOTE) The Xpert Xpress SARS-CoV-2/FLU/RSV plus assay is intended as an aid in the diagnosis of influenza from Nasopharyngeal swab specimens and should not be used as a sole basis for treatment. Nasal washings and aspirates are unacceptable for Xpert Xpress SARS-CoV-2/FLU/RSV testing.  Fact Sheet for Patients: EntrepreneurPulse.com.au  Fact Sheet for Healthcare Providers: IncredibleEmployment.be  This test is not yet approved or cleared by the [place] FDA and has been authorized for detection and/or diagnosis of SARS-CoV-2 by FDA under an Emergency Use Authorization (EUA). This EUA will remain in effect (meaning this test can be used) for the duration of the COVID-19 declaration under Section 564(b)(1) of the Act, 21 U.S.C. section 360bbb-3(b)(1), unless the authorization is terminated or revoked.  Performed at [facility], [address], [place], [place] [address]          Radiology Studies: CT HEAD WO CONTRAST  Result Date: 01/20/2021 CLINICAL DATA:  Altered mental status. EXAM: CT HEAD WITHOUT CONTRAST TECHNIQUE: Contiguous axial images were obtained from the base of the skull through the vertex without intravenous contrast. COMPARISON:  April 13, 2019. FINDINGS: Brain: Mild chronic ischemic white matter disease is noted. New low densities are noted in the right cerebellar hemisphere and right parietal cortex as well as left occipital lobe. These are concerning for acute infarction, and MRI is recommended for further evaluation. Ventricular size is within normal limits. No midline shift is noted. No hemorrhage is  noted. Vascular: No hyperdense vessel or unexpected calcification. Skull: Normal. Negative for fracture or focal lesion. Sinuses/Orbits: No acute finding. Other: None. IMPRESSION: New low densities are noted in the right cerebellar hemisphere, right parietal cortex and left occipital lobe concerning for acute infarction. MRI is recommended for further evaluation. Electronically Signed   By: Marijo Conception M.D.   On: 01/20/2021 08:51   MR ANGIO HEAD WO CONTRAST  Result Date: 01/20/2021 CLINICAL DATA:  Neuro deficit, acute, stroke suspected. EXAM: MRI HEAD WITHOUT CONTRAST MRA HEAD WITHOUT CONTRAST TECHNIQUE: Multiplanar, multi-echo pulse sequences of the brain and surrounding structures were acquired without intravenous contrast. Angiographic images of the Circle of Willis were acquired using MRA technique without intravenous contrast. COMPARISON: No pertinent prior exam. COMPARISON:  No pertinent prior exam.  FINDINGS: MRI HEAD FINDINGS Brain: Multiple foci of restricted diffusion are seen scattered throughout the bilateral cerebellar hemispheres, pontine tectum, bilateral cerebellar hemispheres and basal ganglia, consistent with acute/subacute infarcts. The largest areas involve the left occipital temporal region, left parietal lobe and posterior right temporal lobe. Linear susceptibility artifact in the anterior right frontal lobe suggests hemosiderin deposit from remote hemorrhage. Multiple punctate foci of susceptibility artifact are seen within the bilateral cerebellar and cerebral hemispheres, basal ganglia, right thalamus and within the pons, also most consistent with hemosiderin deposits. No acute hemorrhage, hydrocephalus, extra-axial collection or mass lesion. Scattered and confluent foci of T2 hyperintensity are seen within the white matter of the cerebral hemispheres, nonspecific, most likely related to chronic small vessel ischemia. Small areas of encephalomalacia and gliosis involving the body of  the corpus callosum and bilateral centrum semiovale and corona radiata. Vascular: Normal flow voids. Skull and upper cervical spine: Normal marrow signal. Sinuses/Orbits: Bilateral lens surgery. Paranasal sinuses are essentially clear. MRA HEAD FINDINGS The visualized portions of the distal cervical and intracranial internal carotid arteries are widely patent with normal flow related enhancement. Luminal irregularity is seen along the bilateral ACA vascular tree with areas of moderate stenosis at the A3 and A4 segments of the right ACA. Luminal irregularities are also seen along the bilateral MCA vascular tree with multifocal areas of mild stenosis as well as mild ectasia of the proximal left M2/MCA inferior division branch and both M2 segments of the right MCA. The vertebral arteries are widely patent with antegrade flow. The posterior inferior cerebral arteries are normal. Vertebrobasilar junction and basilar artery are widely patent with antegrade flow without evidence of basilar stenosis or aneurysm. Luminal irregularities are seen along the bilateral posterior cerebral arteries with moderate stenosis at the mid P2 segment of the right PCA. IMPRESSION: 1. Multifocal supratentorial and infratentorial areas of acute/subacute infarcts suggesting embolic event. The largest areas of infarct involve the left occipital temporal region, left parietal lobe and posterior right temporal lobe. 2. Hemosiderin deposits in the right frontal lobe, bilateral cerebellar and cerebral hemisphere, as well as basal ganglia, right thalamus and pons suggesting with prior microhemorrhages. 3. Moderate to advanced chronic microvascular ischemic changes of the white matter. 4. No intracranial large vessel occlusion. 5. Findings consistent with intracranial atherosclerotic disease with focal areas of moderate stenosis the right A3 and A4/ACA segments and in the mid P2/PCA segment. Electronically Signed   By: Pedro Earls  M.D.   On: 01/20/2021 12:31   MR BRAIN WO CONTRAST  Result Date: 01/20/2021 CLINICAL DATA:  Neuro deficit, acute, stroke suspected. EXAM: MRI HEAD WITHOUT CONTRAST MRA HEAD WITHOUT CONTRAST TECHNIQUE: Multiplanar, multi-echo pulse sequences of the brain and surrounding structures were acquired without intravenous contrast. Angiographic images of the Circle of Willis were acquired using MRA technique without intravenous contrast. COMPARISON: No pertinent prior exam. COMPARISON:  No pertinent prior exam. FINDINGS: MRI HEAD FINDINGS Brain: Multiple foci of restricted diffusion are seen scattered throughout the bilateral cerebellar hemispheres, pontine tectum, bilateral cerebellar hemispheres and basal ganglia, consistent with acute/subacute infarcts. The largest areas involve the left occipital temporal region, left parietal lobe and posterior right temporal lobe. Linear susceptibility artifact in the anterior right frontal lobe suggests hemosiderin deposit from remote hemorrhage. Multiple punctate foci of susceptibility artifact are seen within the bilateral cerebellar and cerebral hemispheres, basal ganglia, right thalamus and within the pons, also most consistent with hemosiderin deposits. No acute hemorrhage, hydrocephalus, extra-axial collection or mass lesion. Scattered and confluent  foci of T2 hyperintensity are seen within the white matter of the cerebral hemispheres, nonspecific, most likely related to chronic small vessel ischemia. Small areas of encephalomalacia and gliosis involving the body of the corpus callosum and bilateral centrum semiovale and corona radiata. Vascular: Normal flow voids. Skull and upper cervical spine: Normal marrow signal. Sinuses/Orbits: Bilateral lens surgery. Paranasal sinuses are essentially clear. MRA HEAD FINDINGS The visualized portions of the distal cervical and intracranial internal carotid arteries are widely patent with normal flow related enhancement. Luminal  irregularity is seen along the bilateral ACA vascular tree with areas of moderate stenosis at the A3 and A4 segments of the right ACA. Luminal irregularities are also seen along the bilateral MCA vascular tree with multifocal areas of mild stenosis as well as mild ectasia of the proximal left M2/MCA inferior division branch and both M2 segments of the right MCA. The vertebral arteries are widely patent with antegrade flow. The posterior inferior cerebral arteries are normal. Vertebrobasilar junction and basilar artery are widely patent with antegrade flow without evidence of basilar stenosis or aneurysm. Luminal irregularities are seen along the bilateral posterior cerebral arteries with moderate stenosis at the mid P2 segment of the right PCA. IMPRESSION: 1. Multifocal supratentorial and infratentorial areas of acute/subacute infarcts suggesting embolic event. The largest areas of infarct involve the left occipital temporal region, left parietal lobe and posterior right temporal lobe. 2. Hemosiderin deposits in the right frontal lobe, bilateral cerebellar and cerebral hemisphere, as well as basal ganglia, right thalamus and pons suggesting with prior microhemorrhages. 3. Moderate to advanced chronic microvascular ischemic changes of the white matter. 4. No intracranial large vessel occlusion. 5. Findings consistent with intracranial atherosclerotic disease with focal areas of moderate stenosis the right A3 and A4/ACA segments and in the mid P2/PCA segment. Electronically Signed   By: Pedro Earls M.D.   On: 01/20/2021 12:31   US Carotid Bilateral (at [facility] and [facility] only)  Result Date: 01/20/2021 CLINICAL DATA:  Stroke.  History of hypertension and hyperlipidemia. EXAM: BILATERAL CAROTID DUPLEX ULTRASOUND TECHNIQUE: Pearline Cables scale imaging, color Doppler and duplex ultrasound were performed of bilateral carotid and vertebral arteries in the neck. COMPARISON:  None. FINDINGS: Criteria: Quantification of  carotid stenosis is based on velocity parameters that correlate the residual internal carotid diameter with NASCET-based stenosis levels, using the diameter of the distal internal carotid lumen as the denominator for stenosis measurement. The following velocity measurements were obtained: RIGHT ICA: 118/13 cm/sec CCA: [id number] cm/sec SYSTOLIC ICA/CCA RATIO:  1.3 ECA: 94 cm/sec LEFT ICA: 89/27 cm/sec CCA: [id number] cm/sec SYSTOLIC ICA/CCA RATIO:  1.2 ECA: 105 cm/sec RIGHT CAROTID ARTERY: There is a moderate amount of eccentric echogenic plaque within the right carotid bulb (image 15), extending to involve the origin and proximal aspects of the right internal carotid artery (image 22) which results in borderline elevated peak systolic velocities with the proximal aspect the right internal carotid artery. Greatest acquired peak systolic velocity in the proximal right ICA measures 118 centimeters/second (image 24). The distal aspect of the right internal carotid artery is noted to be tortuous (image 28). RIGHT VERTEBRAL ARTERY:  Antegrade Flow LEFT CAROTID ARTERY: There is a minimal amount of eccentric echogenic plaque within the left carotid bulb (images 45 and 46), extending to involve the origin and proximal aspects of the left internal carotid artery (image 53), not resulting in elevated peak systolic velocities within the interrogated course the left internal carotid artery to suggest a hemodynamically significant stenosis. The distal  aspect of the left internal carotid artery is noted be tortuous (image 59). Elevated peak systolic velocity within distal aspect the left internal carotid artery is felt to be factitious slightly elevated due to sampling at a location of turbulent flow. LEFT VERTEBRAL ARTERY:  Antegrade flow IMPRESSION: 1. Moderate amount of right-sided atherosclerotic plaque results in borderline elevated peak systolic velocities within the right internal carotid artery which approach the 50% luminal  narrowing range. Further evaluation with CTA could be performed as clinically indicated. 2. Minimal amount of left-sided atherosclerotic plaque, not definitely resulting in a hemodynamically significant stenosis. Electronically Signed   By: Sandi Mariscal M.D.   On: 01/20/2021 12:36        Scheduled Meds: . sodium chloride   Intravenous Once  . aspirin  300 mg Rectal Daily   Or  . aspirin  325 mg Oral Daily  . atorvastatin  40 mg Oral QHS  . Chlorhexidine Gluconate Cloth  6 each Topical Once  . citalopram  10 mg Oral Daily  . docusate sodium  100 mg Oral BID  . donepezil  10 mg Oral QPM  . enoxaparin (LOVENOX) injection  40 mg Subcutaneous Q24H  . feeding supplement  237 mL Oral BID BM  . ferrous [id number] C-folic acid  1 capsule Oral BID  . gabapentin  600 mg Oral Daily  . lisinopril  7.5 mg Oral QHS  . melatonin  2.5 mg Oral QHS  . montelukast  10 mg Oral QHS  . multivitamin with minerals  1 tablet Oral Daily  . oxybutynin  5 mg Oral BID  . pantoprazole  40 mg Oral q AM  . vitamin B-12  1,000 mcg Oral Daily   Continuous Infusions: . sodium chloride Stopped (01/21/21 0807)     LOS: 4 days    Time spent: 32 minutes, more than 50% time spent on direct patient care.    Sharen Hones, MD [facility]   To contact the attending provider between 7A-7P or the covering provider during after hours 7P-7A, please log into the web site [email or web address] and access using universal [place] password for that web site. If you do not have the password, please call the hospital operator.  01/21/2021, 11:18 AM

## 2021-01-21 NOTE — Progress Notes (Signed)
PT Cancellation Note  Patient Details Name: Crystal Burch MRN: [id number] DOB: [birth year]   Cancelled Treatment:    Reason Eval/Treat Not Completed: Medical issues which prohibited therapy. At this time PT to sign off due to pt's limited ability to participate with rehab services. Please re-consult when patient is medically appropriate for PT re-evaluation as able.    Lieutenant Diego PT, DPT 12:19 PM,01/21/21

## 2021-01-21 NOTE — Progress Notes (Signed)
Subjective: Patient only responding to deep sternal rub this morning per RN. Also observed was frequent yawning.   Objective: Current vital signs: BP (!) 156/68 (BP Location: Left Arm)   Pulse 76   Temp 100 F (37.8 C)   Resp 18   Ht 5' (1.524 m)   Wt 61 kg   SpO2 96%   BMI 26.26 kg/m  Vital signs in last 24 hours: Temp:  [98.4 F (36.9 C)-100 F (37.8 C)] 100 F (37.8 C) (05/19 1505) Pulse Rate:  [76-96] 76 (05/19 1505) Resp:  [16-20] 18 (05/19 1505) BP: (156-200)/(64-99) 156/68 (05/19 1505) SpO2:  [96 %-99 %] 96 % (05/19 1505)  Intake/Output from previous day: 05/18 0701 - 05/19 0700 In: 406.7 [I.V.:40; Blood:366.7] Out: 2400 [Urine:2400] Intake/Output this shift: Total I/O In: 1585.2 [I.V.:1385.2; IV Piggyback:200] Out: 1300 [Urine:1050; Drains:250] Nutritional status:  Diet Order            Diet NPO time specified  Diet effective now                HEENT: [place]/AT. No nuchal rigidity.  Lungs: No increased work of breathing. Sonorous respirations at times.  Ext: Warm and well-perfused. Drain and dressing to RLE is noted.   Neurologic Examination: Mental Status: Obtunded. No eye opening to any stimuli. Not responding to any verbal commands. No attempts to communicate. No purposeful movement to noxious. Will grimace and furrow brow to noxious.  Cranial Nerves: II:  Pupils 2 mm and reactive. No blink to threat. III,IV, VI: Oculocephalic reflex intact. No forced gaze deviation.  V,VII: Grimaces on right < left in response to noxious brow ridge pressure on the same side.  VIII: No response to voice IX,X: Unable to assess XI: Unable to assess XII: Unable to assess.  Motor/Sensory: Flaccid tone BUE. No posturing or other movement to repeated arm pinch on each side.  Decreased tone BLE with weak knee flexion bilaterally to noxious plantar stimulation.   Deep Tendon Reflexes:  Unchanged Cerebellar/Gait: Unable to assess   Lab Results: Results for orders  placed or performed during the hospital encounter of 01/17/21 (from the past 48 hour(s))  CBC with Differential/Platelet     Status: Abnormal   Collection Time: 01/20/21  4:38 AM  Result Value Ref Range   WBC 6.2 4.0 - 10.5 K/uL   RBC 2.17 (L) 3.87 - 5.11 MIL/uL   Hemoglobin 6.8 (L) 12.0 - 15.0 g/dL   HCT 21.0 (L) 36.0 - 46.0 %   MCV 96.8 80.0 - 100.0 fL   MCH 31.3 26.0 - 34.0 pg   MCHC 32.4 30.0 - 36.0 g/dL   RDW 14.7 11.5 - 15.5 %   Platelets 117 (L) 150 - 400 K/uL    Comment: Immature Platelet Fraction may be clinically indicated, consider ordering this additional test [id number] CONSISTENT WITH PREVIOUS RESULT    nRBC 0.0 0.0 - 0.2 %   Neutrophils Relative % 65 %   Neutro Abs 4.0 1.7 - 7.7 K/uL   Lymphocytes Relative 22 %   Lymphs Abs 1.4 0.7 - 4.0 K/uL   Monocytes Relative 12 %   Monocytes Absolute 0.8 0.1 - 1.0 K/uL   Eosinophils Relative 0 %   Eosinophils Absolute 0.0 0.0 - 0.5 K/uL   Basophils Relative 0 %   Basophils Absolute 0.0 0.0 - 0.1 K/uL   Immature Granulocytes 1 %   Abs Immature Granulocytes 0.03 0.00 - 0.07 K/uL    Comment: Performed at [facility], [address], [place], [place] [address]  ABO/Rh     Status: None   Collection Time: 01/20/21  4:38 AM  Result Value Ref Range   ABO/RH(D)      A NEG Performed at [facility], [place], [place] [address]   Glucose, capillary     Status: Abnormal   Collection Time: 01/20/21  5:45 AM  Result Value Ref Range   Glucose-Capillary 108 (H) Crystal - 99 mg/dL    Comment: Glucose reference range applies only to samples taken after fasting for at least 8 hours.  Blood gas, arterial     Status: Abnormal   Collection Time: 01/20/21  6:15 AM  Result Value Ref Range   FIO2 21.00    pH, Arterial 7.43 7.350 - 7.450   pCO2 arterial 40 32.0 - 48.0 mmHg   pO2, Arterial 77 (L) 83.0 - 108.0 mmHg   Bicarbonate 26.5 20.0 - 28.0 mmol/L   Acid-Base Excess 2.0 0.0 - 2.0 mmol/L   O2 Saturation  95.6 %   Patient temperature 37.0    Collection site RIGHT RADIAL    Sample type ARTERIAL DRAW    Allens test (pass/fail) PASS PASS    Comment: Performed at [facility] [address], [place], [place] [address]  Prepare RBC (crossmatch)     Status: None   Collection Time: 01/20/21  8:20 AM  Result Value Ref Range   Order Confirmation      ORDER PROCESSED BY BLOOD BANK Performed at [facility], [address] [place], [place] [address]   Glucose, capillary     Status: Abnormal   Collection Time: 01/20/21  8:36 AM  Result Value Ref Range   Glucose-Capillary 105 (H) Crystal - 99 mg/dL    Comment: Glucose reference range applies only to samples taken after fasting for at least 8 hours.  Prepare RBC (crossmatch)     Status: None   Collection Time: 01/20/21 10:01 AM  Result Value Ref Range   Order Confirmation      DUPLICATE REQUEST Performed at [facility], [address] [place], [place] [address]   Hemoglobin and hematocrit, blood     Status: Abnormal   Collection Time: 01/20/21  4:23 PM  Result Value Ref Range   Hemoglobin 9.3 (L) 12.0 - 15.0 g/dL   HCT 28.0 (L) 36.0 - 46.0 %    Comment: Performed at [facility], [address] [place], [place] [address]  Lipid panel     Status: None   Collection Time: 01/21/21  4:31 AM  Result Value Ref Range   Cholesterol 133 0 - 200 mg/dL   Triglycerides 60 <150 mg/dL   HDL 63 >40 mg/dL   Total CHOL/HDL Ratio 2.1 RATIO   VLDL 12 0 - 40 mg/dL   LDL Cholesterol 58 0 - 99 mg/dL    Comment:        Total Cholesterol/HDL:CHD Risk Coronary Heart Disease Risk Table                     Men   Women  1/2 Average Risk   3.4   3.3  Average Risk       5.0   4.4  2 X Average Risk   9.6   7.1  3 X Average Risk  23.4   11.0        Use the calculated Patient Ratio above and the CHD Risk Table to determine the  patient's CHD Risk.        ATP III CLASSIFICATION (LDL):  <100     mg/dL   Optimal  100-129   mg/dL   Near or Above                    Optimal  130-159  mg/dL   Borderline  160-189  mg/dL   High  >190     mg/dL   Very High Performed at [facility], [address] [place], [place] [address]   CBC with Differential/Platelet     Status: Abnormal   Collection Time: 01/21/21  4:31 AM  Result Value Ref Range   WBC 7.2 4.0 - 10.5 K/uL   RBC 3.07 (L) 3.87 - 5.11 MIL/uL   Hemoglobin 9.5 (L) 12.0 - 15.0 g/dL   HCT 28.4 (L) 36.0 - 46.0 %   MCV 92.5 80.0 - 100.0 fL   MCH 30.9 26.0 - 34.0 pg   MCHC 33.5 30.0 - 36.0 g/dL   RDW 16.1 (H) 11.5 - 15.5 %   Platelets 123 (L) 150 - 400 K/uL    Comment: Immature Platelet Fraction may be clinically indicated, consider ordering this additional test [id number]    nRBC 0.3 (H) 0.0 - 0.2 %   Neutrophils Relative % 77 %   Neutro Abs 5.5 1.7 - 7.7 K/uL   Lymphocytes Relative 13 %   Lymphs Abs 1.0 0.7 - 4.0 K/uL   Monocytes Relative 9 %   Monocytes Absolute 0.6 0.1 - 1.0 K/uL   Eosinophils Relative 0 %   Eosinophils Absolute 0.0 0.0 - 0.5 K/uL   Basophils Relative 0 %   Basophils Absolute 0.0 0.0 - 0.1 K/uL   Immature Granulocytes 1 %   Abs Immature Granulocytes 0.08 (H) 0.00 - 0.07 K/uL    Comment: Performed at [facility], [address] [place], [place] [address]  Basic metabolic panel     Status: Abnormal   Collection Time: 01/21/21  4:31 AM  Result Value Ref Range   Sodium 139 135 - 145 mmol/L   Potassium 3.3 (L) 3.5 - 5.1 mmol/L   Chloride 109 98 - 111 mmol/L   CO2 22 22 - 32 mmol/L   Glucose, Bld 111 (H) Crystal - 99 mg/dL    Comment: Glucose reference range applies only to samples taken after fasting for at least 8 hours.   BUN 17 8 - 23 mg/dL   Creatinine, Ser 0.52 0.44 - 1.00 mg/dL   Calcium 8.1 (L) 8.9 - 10.3 mg/dL   GFR, Estimated >60 >60 mL/min    Comment: (NOTE) Calculated using the CKD-EPI Creatinine Equation (2021)    Anion gap 8 5 - 15    Comment: Performed at [facility], [address] [place], [place] [address]  Hemoglobin A1c     Status: None   Collection Time: 01/21/21  4:31 AM  Result Value Ref Range   Hgb A1c MFr Bld 5.6 4.8 - 5.6 %    Comment: (NOTE) Pre diabetes:          5.7%-6.4%  Diabetes:              >6.4%  Glycemic control for   <7.0% adults with diabetes    Mean Plasma Glucose 114.02 mg/dL    Comment: Performed at [facility] [address] [place], [place] [address]  Magnesium     Status: None   Collection Time: 01/21/21  4:31  AM  Result Value Ref Range   Magnesium 2.0 1.7 - 2.4 mg/dL    Comment: Performed at [facility], [address] [place], [place] [address]    Recent Results (from the past 240 hour(s))  Resp Panel by RT-PCR (Flu A&B, Covid) Nasopharyngeal Swab     Status: None   Collection Time: 01/17/21 11:12 AM   Specimen: Nasopharyngeal Swab; Nasopharyngeal(NP) swabs in vial transport medium  Result Value Ref Range Status   SARS Coronavirus 2 by RT PCR NEGATIVE NEGATIVE Final    Comment: (NOTE) SARS-CoV-2 target nucleic acids are NOT DETECTED.  The SARS-CoV-2 RNA is generally detectable in upper respiratory specimens during the acute phase of infection. The lowest concentration of SARS-CoV-2 viral copies this assay can detect is 138 copies/mL. A negative result does not preclude SARS-Cov-2 infection and should not be used as the sole basis for treatment or other patient management decisions. A negative result may occur with  improper specimen collection/handling, submission of specimen other than nasopharyngeal swab, presence of viral mutation(s) within the areas targeted by this assay, and inadequate number of viral copies(<138 copies/mL). A negative result must be combined with clinical observations, patient history, and epidemiological information. The expected result is Negative.  Fact Sheet for Patients:  EntrepreneurPulse.com.au  Fact Sheet for Healthcare Providers:   IncredibleEmployment.be  This test is no t yet approved or cleared by the [place] FDA and  has been authorized for detection and/or diagnosis of SARS-CoV-2 by FDA under an Emergency Use Authorization (EUA). This EUA will remain  in effect (meaning this test can be used) for the duration of the COVID-19 declaration under Section 564(b)(1) of the Act, 21 U.S.C.section 360bbb-3(b)(1), unless the authorization is terminated  or revoked sooner.       Influenza A by PCR NEGATIVE NEGATIVE Final   Influenza B by PCR NEGATIVE NEGATIVE Final    Comment: (NOTE) The Xpert Xpress SARS-CoV-2/FLU/RSV plus assay is intended as an aid in the diagnosis of influenza from Nasopharyngeal swab specimens and should not be used as a sole basis for treatment. Nasal washings and aspirates are unacceptable for Xpert Xpress SARS-CoV-2/FLU/RSV testing.  Fact Sheet for Patients: EntrepreneurPulse.com.au  Fact Sheet for Healthcare Providers: IncredibleEmployment.be  This test is not yet approved or cleared by the [place] FDA and has been authorized for detection and/or diagnosis of SARS-CoV-2 by FDA under an Emergency Use Authorization (EUA). This EUA will remain in effect (meaning this test can be used) for the duration of the COVID-19 declaration under Section 564(b)(1) of the Act, 21 U.S.C. section 360bbb-3(b)(1), unless the authorization is terminated or revoked.  Performed at [facility], [place], [place] [address]     Lipid Panel Recent Labs    01/21/21 0431  CHOL 133  TRIG 60  HDL 63  CHOLHDL 2.1  VLDL 12  LDLCALC 58    Studies/Results: CT HEAD WO CONTRAST  Result Date: 01/21/2021 CLINICAL DATA:  Stroke, follow-up. EXAM: CT HEAD WITHOUT CONTRAST TECHNIQUE: Contiguous axial images were obtained from the base of the skull through the vertex without intravenous contrast. COMPARISON:  MRI/MRA head  01/20/2021. FINDINGS: Brain: Cerebral volume is normal. Known acute/early subacute infarcts within the cortical/subcortical bilateral cerebral hemispheres, basal ganglia, corpus callosum, pons and bilateral cerebellar hemispheres. The infarcts have not appreciably increased in extent as compared to the brain MRI of 01/20/2021. As before, the largest infarcts are present within the left parietal lobe, left temporal occipital lobes, posterior right temporal  lobe and right cerebellum. No evidence of hemorrhagic conversion. Mild local mass effect associated with some of the infarcts. No midline shift or ventricular entrapment. Stable background advanced cerebral white matter chronic small vessel ischemic disease. No extra-axial fluid collection. No evidence of intracranial mass. Vascular: No hyperdense vessel.  Atherosclerotic calcifications. Skull: Normal. Negative for fracture or focal lesion. Sinuses/Orbits: Visualized orbits show no acute finding. Mild bilateral ethmoid and left maxillary sinus mucosal thickening. IMPRESSION: Multifocal acute/early subacute supratentorial and infratentorial infarcts, not appreciably changed in extent as compared to the brain MRI of 01/20/2021. No evidence of hemorrhagic conversion. Mild local mass effect associated with some of the infarcts, without midline shift or ventricular entrapment. Stable background severe cerebral white matter chronic small vessel ischemic disease. Electronically Signed   By: Kellie Simmering DO   On: 01/21/2021 13:10   CT HEAD WO CONTRAST  Result Date: 01/20/2021 CLINICAL DATA:  Altered mental status. EXAM: CT HEAD WITHOUT CONTRAST TECHNIQUE: Contiguous axial images were obtained from the base of the skull through the vertex without intravenous contrast. COMPARISON:  April 13, 2019. FINDINGS: Brain: Mild chronic ischemic white matter disease is noted. New low densities are noted in the right cerebellar hemisphere and right parietal cortex as well as left  occipital lobe. These are concerning for acute infarction, and MRI is recommended for further evaluation. Ventricular size is within normal limits. No midline shift is noted. No hemorrhage is noted. Vascular: No hyperdense vessel or unexpected calcification. Skull: Normal. Negative for fracture or focal lesion. Sinuses/Orbits: No acute finding. Other: None. IMPRESSION: New low densities are noted in the right cerebellar hemisphere, right parietal cortex and left occipital lobe concerning for acute infarction. MRI is recommended for further evaluation. Electronically Signed   By: Marijo Conception M.D.   On: 01/20/2021 08:51   MR ANGIO HEAD WO CONTRAST  Result Date: 01/20/2021 CLINICAL DATA:  Neuro deficit, acute, stroke suspected. EXAM: MRI HEAD WITHOUT CONTRAST MRA HEAD WITHOUT CONTRAST TECHNIQUE: Multiplanar, multi-echo pulse sequences of the brain and surrounding structures were acquired without intravenous contrast. Angiographic images of the Circle of Willis were acquired using MRA technique without intravenous contrast. COMPARISON: No pertinent prior exam. COMPARISON:  No pertinent prior exam. FINDINGS: MRI HEAD FINDINGS Brain: Multiple foci of restricted diffusion are seen scattered throughout the bilateral cerebellar hemispheres, pontine tectum, bilateral cerebellar hemispheres and basal ganglia, consistent with acute/subacute infarcts. The largest areas involve the left occipital temporal region, left parietal lobe and posterior right temporal lobe. Linear susceptibility artifact in the anterior right frontal lobe suggests hemosiderin deposit from remote hemorrhage. Multiple punctate foci of susceptibility artifact are seen within the bilateral cerebellar and cerebral hemispheres, basal ganglia, right thalamus and within the pons, also most consistent with hemosiderin deposits. No acute hemorrhage, hydrocephalus, extra-axial collection or mass lesion. Scattered and confluent foci of T2 hyperintensity are  seen within the white matter of the cerebral hemispheres, nonspecific, most likely related to chronic small vessel ischemia. Small areas of encephalomalacia and gliosis involving the body of the corpus callosum and bilateral centrum semiovale and corona radiata. Vascular: Normal flow voids. Skull and upper cervical spine: Normal marrow signal. Sinuses/Orbits: Bilateral lens surgery. Paranasal sinuses are essentially clear. MRA HEAD FINDINGS The visualized portions of the distal cervical and intracranial internal carotid arteries are widely patent with normal flow related enhancement. Luminal irregularity is seen along the bilateral ACA vascular tree with areas of moderate stenosis at the A3 and A4 segments of the right ACA. Luminal irregularities are also  seen along the bilateral MCA vascular tree with multifocal areas of mild stenosis as well as mild ectasia of the proximal left M2/MCA inferior division branch and both M2 segments of the right MCA. The vertebral arteries are widely patent with antegrade flow. The posterior inferior cerebral arteries are normal. Vertebrobasilar junction and basilar artery are widely patent with antegrade flow without evidence of basilar stenosis or aneurysm. Luminal irregularities are seen along the bilateral posterior cerebral arteries with moderate stenosis at the mid P2 segment of the right PCA. IMPRESSION: 1. Multifocal supratentorial and infratentorial areas of acute/subacute infarcts suggesting embolic event. The largest areas of infarct involve the left occipital temporal region, left parietal lobe and posterior right temporal lobe. 2. Hemosiderin deposits in the right frontal lobe, bilateral cerebellar and cerebral hemisphere, as well as basal ganglia, right thalamus and pons suggesting with prior microhemorrhages. 3. Moderate to advanced chronic microvascular ischemic changes of the white matter. 4. No intracranial large vessel occlusion. 5. Findings consistent with  intracranial atherosclerotic disease with focal areas of moderate stenosis the right A3 and A4/ACA segments and in the mid P2/PCA segment. Electronically Signed   By: Pedro Earls M.D.   On: 01/20/2021 12:31   MR BRAIN WO CONTRAST  Result Date: 01/20/2021 CLINICAL DATA:  Neuro deficit, acute, stroke suspected. EXAM: MRI HEAD WITHOUT CONTRAST MRA HEAD WITHOUT CONTRAST TECHNIQUE: Multiplanar, multi-echo pulse sequences of the brain and surrounding structures were acquired without intravenous contrast. Angiographic images of the Circle of Willis were acquired using MRA technique without intravenous contrast. COMPARISON: No pertinent prior exam. COMPARISON:  No pertinent prior exam. FINDINGS: MRI HEAD FINDINGS Brain: Multiple foci of restricted diffusion are seen scattered throughout the bilateral cerebellar hemispheres, pontine tectum, bilateral cerebellar hemispheres and basal ganglia, consistent with acute/subacute infarcts. The largest areas involve the left occipital temporal region, left parietal lobe and posterior right temporal lobe. Linear susceptibility artifact in the anterior right frontal lobe suggests hemosiderin deposit from remote hemorrhage. Multiple punctate foci of susceptibility artifact are seen within the bilateral cerebellar and cerebral hemispheres, basal ganglia, right thalamus and within the pons, also most consistent with hemosiderin deposits. No acute hemorrhage, hydrocephalus, extra-axial collection or mass lesion. Scattered and confluent foci of T2 hyperintensity are seen within the white matter of the cerebral hemispheres, nonspecific, most likely related to chronic small vessel ischemia. Small areas of encephalomalacia and gliosis involving the body of the corpus callosum and bilateral centrum semiovale and corona radiata. Vascular: Normal flow voids. Skull and upper cervical spine: Normal marrow signal. Sinuses/Orbits: Bilateral lens surgery. Paranasal sinuses are  essentially clear. MRA HEAD FINDINGS The visualized portions of the distal cervical and intracranial internal carotid arteries are widely patent with normal flow related enhancement. Luminal irregularity is seen along the bilateral ACA vascular tree with areas of moderate stenosis at the A3 and A4 segments of the right ACA. Luminal irregularities are also seen along the bilateral MCA vascular tree with multifocal areas of mild stenosis as well as mild ectasia of the proximal left M2/MCA inferior division branch and both M2 segments of the right MCA. The vertebral arteries are widely patent with antegrade flow. The posterior inferior cerebral arteries are normal. Vertebrobasilar junction and basilar artery are widely patent with antegrade flow without evidence of basilar stenosis or aneurysm. Luminal irregularities are seen along the bilateral posterior cerebral arteries with moderate stenosis at the mid P2 segment of the right PCA. IMPRESSION: 1. Multifocal supratentorial and infratentorial areas of acute/subacute infarcts suggesting embolic event.  The largest areas of infarct involve the left occipital temporal region, left parietal lobe and posterior right temporal lobe. 2. Hemosiderin deposits in the right frontal lobe, bilateral cerebellar and cerebral hemisphere, as well as basal ganglia, right thalamus and pons suggesting with prior microhemorrhages. 3. Moderate to advanced chronic microvascular ischemic changes of the white matter. 4. No intracranial large vessel occlusion. 5. Findings consistent with intracranial atherosclerotic disease with focal areas of moderate stenosis the right A3 and A4/ACA segments and in the mid P2/PCA segment. Electronically Signed   By: Pedro Earls M.D.   On: 01/20/2021 12:31   US Carotid Bilateral (at [facility] and [facility] only)  Result Date: 01/20/2021 CLINICAL DATA:  Stroke.  History of hypertension and hyperlipidemia. EXAM: BILATERAL CAROTID DUPLEX ULTRASOUND  TECHNIQUE: Pearline Cables scale imaging, color Doppler and duplex ultrasound were performed of bilateral carotid and vertebral arteries in the neck. COMPARISON:  None. FINDINGS: Criteria: Quantification of carotid stenosis is based on velocity parameters that correlate the residual internal carotid diameter with NASCET-based stenosis levels, using the diameter of the distal internal carotid lumen as the denominator for stenosis measurement. The following velocity measurements were obtained: RIGHT ICA: 118/13 cm/sec CCA: [id number] cm/sec SYSTOLIC ICA/CCA RATIO:  1.3 ECA: 94 cm/sec LEFT ICA: 89/27 cm/sec CCA: [id number] cm/sec SYSTOLIC ICA/CCA RATIO:  1.2 ECA: 105 cm/sec RIGHT CAROTID ARTERY: There is a moderate amount of eccentric echogenic plaque within the right carotid bulb (image 15), extending to involve the origin and proximal aspects of the right internal carotid artery (image 22) which results in borderline elevated peak systolic velocities with the proximal aspect the right internal carotid artery. Greatest acquired peak systolic velocity in the proximal right ICA measures 118 centimeters/second (image 24). The distal aspect of the right internal carotid artery is noted to be tortuous (image 28). RIGHT VERTEBRAL ARTERY:  Antegrade Flow LEFT CAROTID ARTERY: There is a minimal amount of eccentric echogenic plaque within the left carotid bulb (images 45 and 46), extending to involve the origin and proximal aspects of the left internal carotid artery (image 53), not resulting in elevated peak systolic velocities within the interrogated course the left internal carotid artery to suggest a hemodynamically significant stenosis. The distal aspect of the left internal carotid artery is noted be tortuous (image 59). Elevated peak systolic velocity within distal aspect the left internal carotid artery is felt to be factitious slightly elevated due to sampling at a location of turbulent flow. LEFT VERTEBRAL ARTERY:  Antegrade flow  IMPRESSION: 1. Moderate amount of right-sided atherosclerotic plaque results in borderline elevated peak systolic velocities within the right internal carotid artery which approach the 50% luminal narrowing range. Further evaluation with CTA could be performed as clinically indicated. 2. Minimal amount of left-sided atherosclerotic plaque, not definitely resulting in a hemodynamically significant stenosis. Electronically Signed   By: Sandi Mariscal M.D.   On: 01/20/2021 12:36    Medications:  Scheduled: . sodium chloride   Intravenous Once  . aspirin  300 mg Rectal Daily   Or  . aspirin  325 mg Oral Daily  . atorvastatin  40 mg Oral QHS  . Chlorhexidine Gluconate Cloth  6 each Topical Once  . citalopram  10 mg Oral Daily  . docusate sodium  100 mg Oral BID  . donepezil  10 mg Oral QPM  . feeding supplement  237 mL Oral BID BM  . ferrous [id number] C-folic acid  1 capsule Oral BID  . gabapentin  600 mg Oral Daily  .  lisinopril  7.5 mg Oral QHS  . melatonin  2.5 mg Oral QHS  . montelukast  10 mg Oral QHS  . multivitamin with minerals  1 tablet Oral Daily  . oxybutynin  5 mg Oral BID  . pantoprazole  40 mg Oral q AM  . vitamin B-12  1,000 mcg Oral Daily   Continuous: . sodium chloride 75 mL/hr at 01/21/21 1210    Assessment: 85 y.o. Burch with a PMHx of HTN, HLD,Charcot-Marie-Tooth disease,neuropathy, prior lacunar stroke, meningioma, depression and dementia who initiallypresented to the hospital for evaluationof right lower extremity pain after a mechanical fall. She was diagnosed with a periprosthetic fracture of her right femur. Sheunderwent ORIFof theperiprosthetic fracture onMonday. The patient on RN checks overnight appeared to be resting comfortably, but did not awaken for a vital check this morning and Rapid Response was called. The patient did appear to have potential left sided droop with her mouth at rest, and upon stimulationshewould make noise but not open her  eyes. CT head revealednew low densities in the right cerebellar hemisphere, right parietal cortex and left occipital lobe concerning forsubacute infarctions.The patient was out of the tPA time window at the time of the CT scan which showed the new strokes. She was started on ASA and Lipitor.  1. Exam reveals an obtunded patient with decreased tone in all 4 extremities. Obtundation is most likely secondary to the high stroke burden seen on MRI.  2. MRI brain: Multifocal supratentorial and infratentorial areas of acute/subacute infarcts suggesting embolic event. The largest areas of infarct involve the left occipital temporal region, left parietal lobe and posterior right temporal lobe. Hemosiderin deposits in the right frontal lobe, bilateral cerebellar and cerebral hemisphere, as well as basal ganglia, right thalamus and pons suggesting prior microhemorrhages. Moderate to advanced chronic microvascular ischemic changes of the white matter. 3. MRA head: No intracranial large vessel occlusion. Findings consistent with intracranial atherosclerotic disease with focal areas of moderate stenosis involving the right A3 and A4/ACA segments and in the mid P2/PCA segment. 4. Carotid ultrasound: Moderate amount of right-sided atherosclerotic plaque results in borderline elevated peak systolic velocities within the right internal carotid artery which approach the 50% luminal narrowing range. Minimal amount of left-sided atherosclerotic plaque, not definitely resulting in a hemodynamically significant stenosis. 5. Stroke Risk Factors - HTN, HLD andprior lacunar stroke 6. Long term neurological outcome is likely to be poor given the patient's advanced age, dementia and high stroke burden seen on MRI.  7. TTE completed with results pending.  8. Repeat CT head today (5/19) reveals multifocal acute/early subacute supratentorial and infratentorial infarcts, not appreciably changed in extent as compared to the brain MRI of  01/20/2021. No evidence of hemorrhagic conversion. Mild local mass effect associated with some of the infarcts, without midline shift or ventricular entrapment. Stable background severe cerebral white matter chronic small vessel ischemic disease.   Recommendations: 1. If TTE is negative will need TEE.  2. Cardiac telemetry.  3. PT consult, OT consult, Speech consult 4. HgbA1c, fasting lipid panel 5. Risk factor modification 6. Frequent neuro checks 7. Modified permissive HTN protocol until tomorrow AM: Treat SBP if > 180.   8. Prophylactic therapy-Continue ASA for now. Anticoagulation may be indicated if telemetry shows a-fib or if TTE shows mural thrombus or severely decreased EF. However, most likely would need to wait about 7 days due to the high stroke burden seen on MRI, which presents a risk for hemorrhagic conversion. There also is possible right frontal  subarachnoid hemorrhage of indeterminate age within one sulcus, which was not mentioned in the Radiology report.    LOS: 4 days   @Electronically  signed: Dr. Kerney Elbe 01/21/2021  6:18 PM

## 2021-01-21 NOTE — Progress Notes (Signed)
   Subjective: 3 Days Post-Op Procedure(s) (LRB): OPEN REDUCTION INTERNAL FIXATION (ORIF) PERIPROSTHETIC FRACTURE (Right) Patient sleeping. Unable to arouse.  Imaging shows CVA. Neurology following.   Plan is to go Skilled nursing facility after hospital stay.  Objective: Vital signs in last 24 hours: Temp:  [98.4 F (36.9 C)-99.7 F (37.6 C)] 99.7 F (37.6 C) (05/19 0745) Pulse Rate:  [77-95] 94 (05/19 0745) Resp:  [16-20] 18 (05/19 0745) BP: (151-200)/(53-86) 172/66 (05/19 0745) SpO2:  [96 %-99 %] 97 % (05/19 0745)  Intake/Output from previous day: 05/18 0701 - 05/19 0700 In: 406.7 [I.V.:40; Blood:366.7] Out: 2400 [Urine:2400] Intake/Output this shift: No intake/output data recorded.  Recent Labs    01/19/21 0559 01/20/21 0438 01/20/21 1623 01/21/21 0431  HGB 7.4* 6.8* 9.3* 9.5*   Recent Labs    01/20/21 0438 01/20/21 1623 01/21/21 0431  WBC 6.2  --  7.2  RBC 2.17*  --  3.07*  HCT 21.0* 28.0* 28.4*  PLT 117*  --  123*   Recent Labs    01/19/21 0559 01/21/21 0431  NA 138 139  K 4.0 3.3*  CL 107 109  CO2 24 22  BUN 23 17  CREATININE 0.84 0.52  GLUCOSE 167* 111*  CALCIUM 8.5* 8.1*   No results for input(s): LABPT, INR in the last 72 hours.  EXAM General - Patient is sleeping.  Extremity - No cellulitis present Compartment soft Dressing - dressing C/D/I, provena intact with 300 cc drainage, stable   Past Medical History:  Diagnosis Date  . Dementia ([place])   . 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   Weight as of this encounter: 61 kg. VSS Acute post op blood loss anemia - Hgb 9.5, stable. S/P 1 unit of PRBC 01/20/2021 Avoid sedating medications Right thigh appears well. No signs of swelling, cellulitis. Drainage is slowing down.    Staples will need to be removed 03-01-2021 Follow up with [place] ortho in 6 weeks   DVT Prophylaxis - Lovenox, TED hose and SCDs Weight-Bearing as tolerated to right leg   T. Rachelle Hora, PA-C [place] 01/21/2021, 9:23 AM

## 2021-01-21 NOTE — Progress Notes (Signed)
OT Cancellation Note  Patient Details Name: Crystal Burch MRN: [id number] DOB: [birth year]   Cancelled Treatment:    Reason Eval/Treat Not Completed: Fatigue/lethargy limiting ability to participate;Patient's level of consciousness. Upon attempt, pt only responding to deep sternal rub, unable to alert. Frequent yawning, left facial droop.  Pt unable to rouse and meaningfully participate in OT Evaluation at this time. Will re-attempt at later date/time as medically appropriate.   Hanley Hays, MPH, MS, OTR/L ascom [phone] 01/21/21, 11:26 AM

## 2021-01-22 ENCOUNTER — Inpatient Hospital Stay: Payer: Medicare (Managed Care)

## 2021-01-22 DIAGNOSIS — J690 Pneumonitis due to inhalation of food and vomit: Secondary | ICD-10-CM | POA: Diagnosis not present

## 2021-01-22 DIAGNOSIS — A419 Sepsis, unspecified organism: Secondary | ICD-10-CM

## 2021-01-22 DIAGNOSIS — S72491D Other fracture of lower end of right femur, subsequent encounter for closed fracture with routine healing: Secondary | ICD-10-CM | POA: Diagnosis not present

## 2021-01-22 LAB — CBC WITH DIFFERENTIAL/PLATELET
Abs Immature Granulocytes: 0.08 10*3/uL — ABNORMAL HIGH (ref 0.00–0.07)
Basophils Absolute: 0 10*3/uL (ref 0.0–0.1)
Basophils Relative: 0 %
Eosinophils Absolute: 0 10*3/uL (ref 0.0–0.5)
Eosinophils Relative: 0 %
HCT: 30.1 % — ABNORMAL LOW (ref 36.0–46.0)
Hemoglobin: 10.1 g/dL — ABNORMAL LOW (ref 12.0–15.0)
Immature Granulocytes: 1 %
Lymphocytes Relative: 14 %
Lymphs Abs: 1.2 10*3/uL (ref 0.7–4.0)
MCH: 30.7 pg (ref 26.0–34.0)
MCHC: 33.6 g/dL (ref 30.0–36.0)
MCV: 91.5 fL (ref 80.0–100.0)
Monocytes Absolute: 0.7 10*3/uL (ref 0.1–1.0)
Monocytes Relative: 8 %
Neutro Abs: 6.7 10*3/uL (ref 1.7–7.7)
Neutrophils Relative %: 77 %
Platelets: 142 10*3/uL — ABNORMAL LOW (ref 150–400)
RBC: 3.29 MIL/uL — ABNORMAL LOW (ref 3.87–5.11)
RDW: 15.2 % (ref 11.5–15.5)
WBC: 8.7 10*3/uL (ref 4.0–10.5)
nRBC: 0 % (ref 0.0–0.2)

## 2021-01-22 LAB — BASIC METABOLIC PANEL
Anion gap: 11 (ref 5–15)
BUN: 14 mg/dL (ref 8–23)
CO2: 19 mmol/L — ABNORMAL LOW (ref 22–32)
Calcium: 8 mg/dL — ABNORMAL LOW (ref 8.9–10.3)
Chloride: 107 mmol/L (ref 98–111)
Creatinine, Ser: 0.49 mg/dL (ref 0.44–1.00)
GFR, Estimated: >60 mL/min (ref >60)
Glucose, Bld: 85 mg/dL (ref 70–99)
Potassium: 2.7 mmol/L — CL (ref 3.5–5.1)
Sodium: 137 mmol/L (ref 135–145)

## 2021-01-22 LAB — MAGNESIUM: Magnesium: 1.9 mg/dL (ref 1.7–2.4)

## 2021-01-22 LAB — PROCALCITONIN: Procalcitonin: <0.1 ng/mL

## 2021-01-22 MED ORDER — ACETAMINOPHEN 325 MG PO TABS: 650.0000 mg | ORAL_TABLET | Freq: Four times a day (QID) | ORAL | Status: DC | PRN

## 2021-01-22 MED ORDER — MORPHINE SULFATE (CONCENTRATE) 10 MG/0.5ML PO SOLN: 5.0000 mg | ORAL | Status: DC | PRN

## 2021-01-22 MED ORDER — GLYCOPYRROLATE 0.2 MG/ML IJ SOLN: 0.2000 mg | INTRAMUSCULAR | Status: DC | PRN

## 2021-01-22 MED ORDER — MORPHINE SULFATE (CONCENTRATE) 10 MG/0.5ML PO SOLN
5.0000 mg | ORAL | Status: DC | PRN
  Administered 2021-01-25 – 2021-01-26 (×2): 5 mg via SUBLINGUAL
  Filled 2021-01-22 (×2): qty 0.5

## 2021-01-22 MED ORDER — HALOPERIDOL LACTATE 2 MG/ML PO CONC
0.5000 mg | ORAL | Status: DC | PRN
  Filled 2021-01-22: qty 0.3

## 2021-01-22 MED ORDER — BISACODYL 10 MG RE SUPP
10.0000 mg | Freq: Every day | RECTAL | Status: DC | PRN
Start: 2021-01-22 — End: 2021-01-26

## 2021-01-22 MED ORDER — GLYCOPYRROLATE 1 MG PO TABS
1.0000 mg | ORAL_TABLET | ORAL | Status: DC | PRN
  Filled 2021-01-22: qty 1

## 2021-01-22 MED ORDER — SODIUM CHLORIDE 0.9 % IV SOLN
3.0000 g | Freq: Four times a day (QID) | INTRAVENOUS | Status: DC
  Administered 2021-01-22: 09:00:00 3 g via INTRAVENOUS
  Filled 2021-01-22: qty 3
  Filled 2021-01-22 (×3): qty 8

## 2021-01-22 MED ORDER — DEXTROSE-NACL 5-0.9 % IV SOLN: INTRAVENOUS | Status: DC

## 2021-01-22 MED ORDER — ACETAMINOPHEN 650 MG RE SUPP: 650.0000 mg | Freq: Four times a day (QID) | RECTAL | Status: DC | PRN

## 2021-01-22 MED ORDER — HALOPERIDOL LACTATE 5 MG/ML IJ SOLN: 0.5000 mg | INTRAMUSCULAR | Status: DC | PRN

## 2021-01-22 MED ORDER — POTASSIUM CHLORIDE 10 MEQ/100ML IV SOLN
10.0000 meq | INTRAVENOUS | Status: DC
  Filled 2021-01-22: qty 100

## 2021-01-22 MED ORDER — HALOPERIDOL 0.5 MG PO TABS
0.5000 mg | ORAL_TABLET | ORAL | Status: DC | PRN
  Filled 2021-01-22: qty 1

## 2021-01-22 MED ORDER — ACETAMINOPHEN 650 MG RE SUPP
650.0000 mg | Freq: Four times a day (QID) | RECTAL | Status: DC | PRN
  Administered 2021-01-22: 650 mg via RECTAL
  Filled 2021-01-22: qty 1

## 2021-01-22 NOTE — Progress Notes (Signed)
[facility]) Hospital Liaison RN note:  Received request from Doran Clay, [facility] for family interest in [place]. Chart reviewed and eligibility was approved. Visited patient at bedside. Spoke with son, Ronalee Belts over the phone to confirm interest and explain services. He verbalized understanding. Unfortunately, Hospice Home is not able to offer a room today. Hospital care team is aware. [facility] Liaison will continue to follow for room availability.  Please call with any hospice related questions or concerns.  Thank you for the opportunity to participate in this patient's care.  Zandra Abts, RN [facility] Liaison  [phone]

## 2021-01-22 NOTE — Plan of Care (Signed)
PMT note:  Consult due to large stroke noted. Chart reviewed. It appears goals are set, patient is now comfort care, and patient has been evaluated by hospice liaison. Please reconsult if needed.   No charge note.

## 2021-01-22 NOTE — Progress Notes (Addendum)
SLP Cancellation Note  Patient Details Name: Crystal Burch MRN: [id number] DOB: 22-Nov-1935   Cancelled treatment:       Reason Eval/Treat Not Completed: Patient's level of consciousness;Medical issues which prohibited therapy;Patient not medically ready (chart reviewed) Per chart review, discussion by MDs w/ Son/family re: pt's mental status not improving each day; "patient has a very large burden of stroke, mental status unlikely to improve". Pt remains poorly responsive. Per MD note, Son has agreed to transition to comfort care w/ inpatient Hospice when a bed is available.  ST services will sign off at this time. MD to reconsult if any new needs arise while admitted.      Orinda Kenner, MS, CCC-SLP Speech Language Pathologist Rehab Services [phone] [facility] 01/22/2021, 1:46 PM

## 2021-01-22 NOTE — Progress Notes (Signed)
Chaplain Burris met briefly with two of PT's family member's to offer support. An evening nurse, Amber, mentioned that care plan still under consideration following recent stroke. Recommend follow-up spiritual care with PT later.

## 2021-01-22 NOTE — Care Management Important Message (Signed)
Important Message  Patient Details  Name: Crystal Burch MRN: [id number] Date of Birth: [birth year]   Medicare Important Message Given:  Other (see comment)  Patient now comfort care.  Medicare IM withheld at this time.     Dannette Barbara 01/22/2021, 10:59 AM

## 2021-01-22 NOTE — TOC Progression Note (Signed)
Transition of Care [facility]) - Progression Note    Patient Details  Name: Crystal Burch MRN: [id number] Date of Birth: 07/08/1936  Transition of Care [facility]) CM/SW Contact  Shelbie Hutching, RN Phone Number: 01/22/2021, 11:08 AM  Clinical Narrative:    RNCM received consult for residential hospice facility.  RNCM has given referral to [facility] with [facility].  RNCM has also called and left a message for the [facility] provider.  Provider will return call.     Expected Discharge Plan: Hospice Medical Facility Barriers to Discharge: Other (must enter comment) (hospice home referal given to [facility])  Expected Discharge Plan and Services Expected Discharge Plan: [place]   Discharge Planning Services: CM Consult Post Acute Care Choice: Hospice Living arrangements for the past 2 months: Single Family Home                 DME Arranged:  (n/a)         HH Arranged: NA           Social Determinants of Health (SDOH) Interventions    Readmission Risk Interventions No flowsheet data found.

## 2021-01-22 NOTE — Progress Notes (Signed)
   Subjective: 4 Days Post-Op Procedure(s) (LRB): OPEN REDUCTION INTERNAL FIXATION (ORIF) PERIPROSTHETIC FRACTURE (Right) Patient sleeping. Opened eyes to sound and touch.  Patient with low grade temp. Thought to have aspiration PNA.  Comfort care measures discussed. Plan for outpatient hospice.    Objective: Vital signs in last 24 hours: Temp:  [98.5 F (36.9 C)-100.2 F (37.9 C)] 100.2 F (37.9 C) (05/20 0715) Pulse Rate:  [76-86] 81 (05/20 0715) Resp:  [16-20] 16 (05/20 0715) BP: (156-182)/(68-89) 178/75 (05/20 0715) SpO2:  [95 %-97 %] 97 % (05/20 0715)  Intake/Output from previous day: 05/19 0701 - 05/20 0700 In: 1952.7 [I.V.:1752.7; IV Piggyback:200] Out: 0630 [Urine:1450; Drains:300] Intake/Output this shift: Total I/O In: -  Out: 300 [Urine:300]  Recent Labs    01/20/21 0438 01/20/21 1623 01/21/21 0431 01/22/21 0612  HGB 6.8* 9.3* 9.5* 10.1*   Recent Labs    01/21/21 0431 01/22/21 0612  WBC 7.2 8.7  RBC 3.07* 3.29*  HCT 28.4* 30.1*  PLT 123* 142*   Recent Labs    01/21/21 0431 01/22/21 0612  NA 139 137  K 3.3* 2.7*  CL 109 107  CO2 22 19*  BUN 17 14  CREATININE 0.52 0.49  GLUCOSE 111* 85  CALCIUM 8.1* 8.0*   No results for input(s): LABPT, INR in the last 72 hours.  EXAM General - Patient is sleeping.  Extremity - No cellulitis present Compartment soft Dressing - dressing C/D/I, provena intact with 300 cc drainage, stable   Past Medical History:  Diagnosis Date  . Dementia ([place])   . 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   Weight as of this encounter: 61 kg. VSS, low grade temp Acute post op blood loss anemia - Hgb 10.1, stable, trending up. S/P 1 unit of PRBC 01/20/2021 Right thigh appears well. No signs of swelling or cellulitis. Drainage has slowed. Patient unresponsive, +CVA. Mental status not improving. Patient transitioning to comfort care.   Staples will need to be removed 2021/02/11 Follow up with [place] orth[facility] in 6 weeks   DVT Prophylaxis - TED hose and SCDs Weight-Bearing as tolerated to right leg   T. Rachelle Hora, PA-C [place] 01/22/2021, 12:06 PM

## 2021-01-22 NOTE — Progress Notes (Signed)
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  Altered mental status Brief Narrative:   Crystal Burch a 85 y.o.femalewith medical history significant for hypertension, hyperlipidemia, neuropathy, depression, anxiety, dementia, Charcot-Marie-Tooth disease, s/p right total knee replacement in July 2016, s/p right total hip replacement in August 2013 with compression in 2017, lateral Crystal Burch, history of lacunar stroke, history of meningioma. She presented to the hospital because of right lower extremity pain after a mechanical fall.  She was found to have right periprosthetic fracture of the femur. She was treated with analgesics. Orthopedic surgeon was consulted to assist with management.She underwent ORIF periprosthetic fracture (right) on 01/18/2021.  5/18. Patient has become unresponsive essentially yesterday evening, CT scan showednew low densities in the right cerebellar hemisphere, right parietal cortex and left occipital lobe concerning for acute infarction. 5/20.  Patient mental status not improving.  Discussed with neurology, patient has a very large burden of stroke, mental status unlikely to improve.  She also developed a high fever this morning, most likely due to aspiration pneumonia. Had a long discussion with the patient son, patient condition is terminal, not possible to have any meaningful recovery.  Will transition to comfort care, may transfer to inpatient hospice when bed is available.   Assessment & Plan:   Principal Problem:   Femur fracture, right (HCC) Active Problems:   Essential hypertension   Hyperlipidemia, mixed   Depression   Memory loss   GERD (gastroesophageal reflux disease)   Hypokalemia   Acute ischemic vertebrobasilar artery brainstem stroke involving right-sided vessel ([place])  #1.  Acute right-sided strokes. Encephalopathy secondary to stroke. Patient  has a very high stroke burden, impossible to have any meaningful recovery.  Therefore, will transition to comfort care.  #2.  Sepsis secondary to aspiration pneumonia.  Not POA. Aspiration pneumonia. Patient spiked high fever, most likely due to aspiration pneumonia. Patient is receiving comfort care, will discontinue antibiotics.  #3.  Right periprosthetic fracture of the femur. Mechanical fall. S/p ORIF.  4.  Acute blood loss anemia  5.  Dementia.  6.  Chronic thrombocytopenia.    Subjective: Patient spiked a high fever this morning, she is still unresponsive. She does not seem to have any respite distress.  No nausea vomiting. She is not having any pain.   Objective: Vitals:   01/21/21 1955 01/21/21 2320 01/22/21 0333 01/22/21 0715  BP: (!) 182/77 (!) 176/89 (!) 159/74 (!) 178/75  Pulse: 86 85 86 81  Resp: 20 20 18 16   Temp: 99 F (37.2 C) 98.5 F (36.9 C) 98.5 F (36.9 C) 100.2 F (37.9 C)  TempSrc: Oral Oral Oral Axillary  SpO2: 97% 96% 97% 97%  Weight:      Height:        Intake/Output Summary (Last 24 hours) at 01/22/2021 0935 Last data filed at 01/22/2021 0333 Gross per 24 hour  Intake 1952.7 ml  Output 1750 ml  Net 202.7 ml   Filed Weights   01/17/21 1121  Weight: 61 kg    Examination:  General exam: Ill-appearing, no distress. Respiratory system: Clear to auscultation. Respiratory effort normal. Cardiovascular system: S1 & S2 heard, RRR. No JVD, murmurs, rubs, gallops or clicks. No pedal edema. Gastrointestinal system: Abdomen is nondistended, soft and nontender. No organomegaly or masses felt. Normal bowel sounds heard. Central nervous system: Unresponsive, eyes deviated to the right side. Extremities: Symmetric  Skin: No rashes, lesions or ulcers  Data Reviewed: I have personally reviewed following labs and imaging studies  CBC: Recent Labs  Lab 01/17/21 1112 01/18/21 0356 01/19/21 0559 01/20/21 0438 01/20/21 1623  01/21/21 0431 01/22/21 0612  WBC 7.0 6.9 8.8 6.2  --  7.2 8.7  NEUTROABS 4.4  --   --  4.0  --  5.5 6.7  HGB 12.4 10.0* 7.4* 6.8* 9.3* 9.5* 10.1*  HCT 39.0 30.6* 23.2* 21.0* 28.0* 28.4* 30.1*  MCV 97.5 96.2 98.7 96.8  --  92.5 91.5  PLT 122* 126* 118* 117*  --  123* 876*   Basic Metabolic Panel: Recent Labs  Lab 01/17/21 1112 01/18/21 0356 01/19/21 0559 01/21/21 0431 01/22/21 0612  NA 142 139 138 139 137  K 3.3* 4.0 4.0 3.3* 2.7*  CL 109 109 107 109 107  CO2 24 25 24 22  19*  GLUCOSE 127* 124* 167* 111* 85  BUN 21 21 23 17 14   CREATININE 0.67 0.65 0.84 0.52 0.49  CALCIUM 9.0 8.8* 8.5* 8.1* 8.0*  MG 2.0  --   --  2.0 1.9   GFR: Estimated Creatinine Clearance: 44.3 mL/min (by C-G formula based on SCr of 0.49 mg/dL). Liver Function Tests: No results for input(s): AST, ALT, ALKPHOS, BILITOT, PROT, ALBUMIN in the last 168 hours. No results for input(s): LIPASE, AMYLASE in the last 168 hours. No results for input(s): AMMONIA in the last 168 hours. Coagulation Profile: Recent Labs  Lab 01/17/21 1112  INR 1.0   Cardiac Enzymes: No results for input(s): CKTOTAL, CKMB, CKMBINDEX, TROPONINI in the last 168 hours. BNP (last 3 results) No results for input(s): PROBNP in the last 8760 hours. HbA1C: Recent Labs    01/21/21 0431  HGBA1C 5.6   CBG: Recent Labs  Lab 01/20/21 0545 01/20/21 0836  GLUCAP 108* 105*   Lipid Profile: Recent Labs    01/21/21 0431  CHOL 133  HDL 63  LDLCALC 58  TRIG 60  CHOLHDL 2.1   Thyroid Function Tests: No results for input(s): TSH, T4TOTAL, FREET4, T3FREE, THYROIDAB in the last 72 hours. Anemia Panel: No results for input(s): VITAMINB12, FOLATE, FERRITIN, TIBC, IRON, RETICCTPCT in the last 72 hours. Sepsis Labs: Recent Labs  Lab 01/22/21 0612  PROCALCITON <0.10    Recent Results (from the past 240 hour(s))  Resp Panel by RT-PCR (Flu A&B, Covid) Nasopharyngeal Swab     Status: None   Collection Time: 01/17/21 11:12 AM    Specimen: Nasopharyngeal Swab; Nasopharyngeal(NP) swabs in vial transport medium  Result Value Ref Range Status   SARS Coronavirus 2 by RT PCR NEGATIVE NEGATIVE Final    Comment: (NOTE) SARS-CoV-2 target nucleic acids are NOT DETECTED.  The SARS-CoV-2 RNA is generally detectable in upper respiratory specimens during the acute phase of infection. The lowest concentration of SARS-CoV-2 viral copies this assay can detect is 138 copies/mL. A negative result does not preclude SARS-Cov-2 infection and should not be used as the sole basis for treatment or other patient management decisions. A negative result may occur with  improper specimen collection/handling, submission of specimen other than nasopharyngeal swab, presence of viral mutation(s) within the areas targeted by this assay, and inadequate number of viral copies(<138 copies/mL). A negative result must be combined with clinical observations, patient history, and epidemiological information. The expected result is Negative.  Fact Sheet for Patients:  EntrepreneurPulse.com.au  Fact Sheet for Healthcare Providers:  IncredibleEmployment.be  This test is no t yet approved or cleared by the [place] and  has been authorized for  detection and/or diagnosis of SARS-CoV-2 by FDA under an Emergency Use Authorization (EUA). This EUA will remain  in effect (meaning this test can be used) for the duration of the COVID-19 declaration under Section 564(b)(1) of the Act, 21 U.S.C.section 360bbb-3(b)(1), unless the authorization is terminated  or revoked sooner.       Influenza A by PCR NEGATIVE NEGATIVE Final   Influenza B by PCR NEGATIVE NEGATIVE Final    Comment: (NOTE) The Xpert Xpress SARS-CoV-2/FLU/RSV plus assay is intended as an aid in the diagnosis of influenza from Nasopharyngeal swab specimens and should not be used as a sole basis for treatment. Nasal washings and aspirates are  unacceptable for Xpert Xpress SARS-CoV-2/FLU/RSV testing.  Fact Sheet for Patients: EntrepreneurPulse.com.au  Fact Sheet for Healthcare Providers: IncredibleEmployment.be  This test is not yet approved or cleared by the [place] FDA and has been authorized for detection and/or diagnosis of SARS-CoV-2 by FDA under an Emergency Use Authorization (EUA). This EUA will remain in effect (meaning this test can be used) for the duration of the COVID-19 declaration under Section 564(b)(1) of the Act, 21 U.S.C. section 360bbb-3(b)(1), unless the authorization is terminated or revoked.  Performed at [facility], [address], [place], [place] [address]          Radiology Studies: CT HEAD WO CONTRAST  Result Date: 01/21/2021 CLINICAL DATA:  Stroke, follow-up. EXAM: CT HEAD WITHOUT CONTRAST TECHNIQUE: Contiguous axial images were obtained from the base of the skull through the vertex without intravenous contrast. COMPARISON:  MRI/MRA head 01/20/2021. FINDINGS: Brain: Cerebral volume is normal. Known acute/early subacute infarcts within the cortical/subcortical bilateral cerebral hemispheres, basal ganglia, corpus callosum, pons and bilateral cerebellar hemispheres. The infarcts have not appreciably increased in extent as compared to the brain MRI of 01/20/2021. As before, the largest infarcts are present within the left parietal lobe, left temporal occipital lobes, posterior right temporal lobe and right cerebellum. No evidence of hemorrhagic conversion. Mild local mass effect associated with some of the infarcts. No midline shift or ventricular entrapment. Stable background advanced cerebral white matter chronic small vessel ischemic disease. No extra-axial fluid collection. No evidence of intracranial mass. Vascular: No hyperdense vessel.  Atherosclerotic calcifications. Skull: Normal. Negative for fracture or focal lesion. Sinuses/Orbits:  Visualized orbits show no acute finding. Mild bilateral ethmoid and left maxillary sinus mucosal thickening. IMPRESSION: Multifocal acute/early subacute supratentorial and infratentorial infarcts, not appreciably changed in extent as compared to the brain MRI of 01/20/2021. No evidence of hemorrhagic conversion. Mild local mass effect associated with some of the infarcts, without midline shift or ventricular entrapment. Stable background severe cerebral white matter chronic small vessel ischemic disease. Electronically Signed   By: Kellie Simmering DO   On: 01/21/2021 13:10   MR ANGIO HEAD WO CONTRAST  Result Date: 01/20/2021 CLINICAL DATA:  Neuro deficit, acute, stroke suspected. EXAM: MRI HEAD WITHOUT CONTRAST MRA HEAD WITHOUT CONTRAST TECHNIQUE: Multiplanar, multi-echo pulse sequences of the brain and surrounding structures were acquired without intravenous contrast. Angiographic images of the Circle of Willis were acquired using MRA technique without intravenous contrast. COMPARISON: No pertinent prior exam. COMPARISON:  No pertinent prior exam. FINDINGS: MRI HEAD FINDINGS Brain: Multiple foci of restricted diffusion are seen scattered throughout the bilateral cerebellar hemispheres, pontine tectum, bilateral cerebellar hemispheres and basal ganglia, consistent with acute/subacute infarcts. The largest areas involve the left occipital temporal region, left parietal lobe and posterior right temporal lobe. Linear susceptibility artifact in the anterior right frontal lobe suggests hemosiderin deposit  from remote hemorrhage. Multiple punctate foci of susceptibility artifact are seen within the bilateral cerebellar and cerebral hemispheres, basal ganglia, right thalamus and within the pons, also most consistent with hemosiderin deposits. No acute hemorrhage, hydrocephalus, extra-axial collection or mass lesion. Scattered and confluent foci of T2 hyperintensity are seen within the white matter of the cerebral  hemispheres, nonspecific, most likely related to chronic small vessel ischemia. Small areas of encephalomalacia and gliosis involving the body of the corpus callosum and bilateral centrum semiovale and corona radiata. Vascular: Normal flow voids. Skull and upper cervical spine: Normal marrow signal. Sinuses/Orbits: Bilateral lens surgery. Paranasal sinuses are essentially clear. MRA HEAD FINDINGS The visualized portions of the distal cervical and intracranial internal carotid arteries are widely patent with normal flow related enhancement. Luminal irregularity is seen along the bilateral ACA vascular tree with areas of moderate stenosis at the A3 and A4 segments of the right ACA. Luminal irregularities are also seen along the bilateral MCA vascular tree with multifocal areas of mild stenosis as well as mild ectasia of the proximal left M2/MCA inferior division branch and both M2 segments of the right MCA. The vertebral arteries are widely patent with antegrade flow. The posterior inferior cerebral arteries are normal. Vertebrobasilar junction and basilar artery are widely patent with antegrade flow without evidence of basilar stenosis or aneurysm. Luminal irregularities are seen along the bilateral posterior cerebral arteries with moderate stenosis at the mid P2 segment of the right PCA. IMPRESSION: 1. Multifocal supratentorial and infratentorial areas of acute/subacute infarcts suggesting embolic event. The largest areas of infarct involve the left occipital temporal region, left parietal lobe and posterior right temporal lobe. 2. Hemosiderin deposits in the right frontal lobe, bilateral cerebellar and cerebral hemisphere, as well as basal ganglia, right thalamus and pons suggesting with prior microhemorrhages. 3. Moderate to advanced chronic microvascular ischemic changes of the white matter. 4. No intracranial large vessel occlusion. 5. Findings consistent with intracranial atherosclerotic disease with focal  areas of moderate stenosis the right A3 and A4/ACA segments and in the mid P2/PCA segment. Electronically Signed   By: Pedro Earls M.D.   On: 01/20/2021 12:31   MR BRAIN WO CONTRAST  Result Date: 01/20/2021 CLINICAL DATA:  Neuro deficit, acute, stroke suspected. EXAM: MRI HEAD WITHOUT CONTRAST MRA HEAD WITHOUT CONTRAST TECHNIQUE: Multiplanar, multi-echo pulse sequences of the brain and surrounding structures were acquired without intravenous contrast. Angiographic images of the Circle of Willis were acquired using MRA technique without intravenous contrast. COMPARISON: No pertinent prior exam. COMPARISON:  No pertinent prior exam. FINDINGS: MRI HEAD FINDINGS Brain: Multiple foci of restricted diffusion are seen scattered throughout the bilateral cerebellar hemispheres, pontine tectum, bilateral cerebellar hemispheres and basal ganglia, consistent with acute/subacute infarcts. The largest areas involve the left occipital temporal region, left parietal lobe and posterior right temporal lobe. Linear susceptibility artifact in the anterior right frontal lobe suggests hemosiderin deposit from remote hemorrhage. Multiple punctate foci of susceptibility artifact are seen within the bilateral cerebellar and cerebral hemispheres, basal ganglia, right thalamus and within the pons, also most consistent with hemosiderin deposits. No acute hemorrhage, hydrocephalus, extra-axial collection or mass lesion. Scattered and confluent foci of T2 hyperintensity are seen within the white matter of the cerebral hemispheres, nonspecific, most likely related to chronic small vessel ischemia. Small areas of encephalomalacia and gliosis involving the body of the corpus callosum and bilateral centrum semiovale and corona radiata. Vascular: Normal flow voids. Skull and upper cervical spine: Normal marrow signal. Sinuses/Orbits: Bilateral lens surgery.  Paranasal sinuses are essentially clear. MRA HEAD FINDINGS The  visualized portions of the distal cervical and intracranial internal carotid arteries are widely patent with normal flow related enhancement. Luminal irregularity is seen along the bilateral ACA vascular tree with areas of moderate stenosis at the A3 and A4 segments of the right ACA. Luminal irregularities are also seen along the bilateral MCA vascular tree with multifocal areas of mild stenosis as well as mild ectasia of the proximal left M2/MCA inferior division branch and both M2 segments of the right MCA. The vertebral arteries are widely patent with antegrade flow. The posterior inferior cerebral arteries are normal. Vertebrobasilar junction and basilar artery are widely patent with antegrade flow without evidence of basilar stenosis or aneurysm. Luminal irregularities are seen along the bilateral posterior cerebral arteries with moderate stenosis at the mid P2 segment of the right PCA. IMPRESSION: 1. Multifocal supratentorial and infratentorial areas of acute/subacute infarcts suggesting embolic event. The largest areas of infarct involve the left occipital temporal region, left parietal lobe and posterior right temporal lobe. 2. Hemosiderin deposits in the right frontal lobe, bilateral cerebellar and cerebral hemisphere, as well as basal ganglia, right thalamus and pons suggesting with prior microhemorrhages. 3. Moderate to advanced chronic microvascular ischemic changes of the white matter. 4. No intracranial large vessel occlusion. 5. Findings consistent with intracranial atherosclerotic disease with focal areas of moderate stenosis the right A3 and A4/ACA segments and in the mid P2/PCA segment. Electronically Signed   By: Pedro Earls M.D.   On: 01/20/2021 12:31   US Carotid Bilateral (at [facility] and [facility] only)  Result Date: 01/20/2021 CLINICAL DATA:  Stroke.  History of hypertension and hyperlipidemia. EXAM: BILATERAL CAROTID DUPLEX ULTRASOUND TECHNIQUE: Pearline Cables scale imaging, color Doppler  and duplex ultrasound were performed of bilateral carotid and vertebral arteries in the neck. COMPARISON:  None. FINDINGS: Criteria: Quantification of carotid stenosis is based on velocity parameters that correlate the residual internal carotid diameter with NASCET-based stenosis levels, using the diameter of the distal internal carotid lumen as the denominator for stenosis measurement. The following velocity measurements were obtained: RIGHT ICA: 118/13 cm/sec CCA: [id number] cm/sec SYSTOLIC ICA/CCA RATIO:  1.3 ECA: 94 cm/sec LEFT ICA: 89/27 cm/sec CCA: [id number] cm/sec SYSTOLIC ICA/CCA RATIO:  1.2 ECA: 105 cm/sec RIGHT CAROTID ARTERY: There is a moderate amount of eccentric echogenic plaque within the right carotid bulb (image 15), extending to involve the origin and proximal aspects of the right internal carotid artery (image 22) which results in borderline elevated peak systolic velocities with the proximal aspect the right internal carotid artery. Greatest acquired peak systolic velocity in the proximal right ICA measures 118 centimeters/second (image 24). The distal aspect of the right internal carotid artery is noted to be tortuous (image 28). RIGHT VERTEBRAL ARTERY:  Antegrade Flow LEFT CAROTID ARTERY: There is a minimal amount of eccentric echogenic plaque within the left carotid bulb (images 45 and 46), extending to involve the origin and proximal aspects of the left internal carotid artery (image 53), not resulting in elevated peak systolic velocities within the interrogated course the left internal carotid artery to suggest a hemodynamically significant stenosis. The distal aspect of the left internal carotid artery is noted be tortuous (image 59). Elevated peak systolic velocity within distal aspect the left internal carotid artery is felt to be factitious slightly elevated due to sampling at a location of turbulent flow. LEFT VERTEBRAL ARTERY:  Antegrade flow IMPRESSION: 1. Moderate amount of right-sided  atherosclerotic plaque results in borderline  elevated peak systolic velocities within the right internal carotid artery which approach the 50% luminal narrowing range. Further evaluation with CTA could be performed as clinically indicated. 2. Minimal amount of left-sided atherosclerotic plaque, not definitely resulting in a hemodynamically significant stenosis. Electronically Signed   By: Sandi Mariscal M.D.   On: 01/20/2021 12:36   ECHOCARDIOGRAM COMPLETE  Result Date: 01/21/2021    ECHOCARDIOGRAM REPORT   Patient Name:   Crystal Burch Date of Exam: 01/21/2021 Medical Rec #:  [id number]    Height:       60.0 in Accession #:    [id number]   Weight:       134.5 lb Date of Birth:  09/26/1935   BSA:          1.577 m Patient Age:    77 years     BP:           163/99 mmHg Patient Gender: F            HR:           96 bpm. Exam Location:  [facility] Procedure: 2D Echo, Cardiac Doppler and Color Doppler Indications:     Stroke I63.9  History:         Patient has prior history of Echocardiogram examinations, most                  recent 04/14/2019. Risk Factors:Hypertension.  Sonographer:     Sherrie Sport RDCS (AE) Referring Phys:  [id number] Sharen Hones Diagnosing Phys: Collen Rogue MD  Sonographer Comments: Suboptimal apical window. IMPRESSIONS  1. Left ventricular ejection fraction, by estimation, is 60 to 65%. The left ventricle has normal function. The left ventricle has no regional wall motion abnormalities. Left ventricular diastolic parameters are consistent with Grade I diastolic dysfunction (impaired relaxation).  2. Right ventricular systolic function is normal. The right ventricular size is normal. There is moderately elevated pulmonary artery systolic pressure. The estimated right ventricular systolic pressure is 123456 mmHg.  3. Mild to moderate sized pericardial effusion.  4. The mitral valve is normal in structure. Mild mitral valve regurgitation. FINDINGS  Left Ventricle: Left ventricular ejection fraction, by  estimation, is 60 to 65%. The left ventricle has normal function. The left ventricle has no regional wall motion abnormalities. The left ventricular internal cavity size was normal in size. There is  no left ventricular hypertrophy. Left ventricular diastolic parameters are consistent with Grade I diastolic dysfunction (impaired relaxation). Right Ventricle: The right ventricular size is normal. No increase in right ventricular wall thickness. Right ventricular systolic function is normal. There is moderately elevated pulmonary artery systolic pressure. The tricuspid regurgitant velocity is 3.23 m/s, and with an assumed right atrial pressure of 5 mmHg, the estimated right ventricular systolic pressure is 123456 mmHg. Left Atrium: Left atrial size was normal in size. Right Atrium: Right atrial size was normal in size. Pericardium: A moderately sized pericardial effusion is present. Mitral Valve: The mitral valve is normal in structure. Mild mitral valve regurgitation. No evidence of mitral valve stenosis. Tricuspid Valve: The tricuspid valve is normal in structure. Tricuspid valve regurgitation is mild . No evidence of tricuspid stenosis. Aortic Valve: The aortic valve is normal in structure. Aortic valve regurgitation is not visualized. No aortic stenosis is present. Aortic valve mean gradient measures 4.0 mmHg. Aortic valve peak gradient measures 8.1 mmHg. Aortic valve area, by VTI measures 2.60 cm. Pulmonic Valve: The pulmonic valve was normal in structure. Pulmonic valve regurgitation is not  visualized. No evidence of pulmonic stenosis. Aorta: The aortic root is normal in size and structure. Venous: The inferior vena cava is normal in size with greater than 50% respiratory variability, suggesting right atrial pressure of 3 mmHg. IAS/Shunts: No atrial level shunt detected by color flow Doppler.  LEFT VENTRICLE PLAX 2D LVIDd:         3.70 cm  Diastology LVIDs:         2.25 cm  LV e' medial:    4.90 cm/s LV PW:          1.38 cm  LV E/e' medial:  17.9 LV IVS:        0.80 cm  LV e' lateral:   5.11 cm/s LVOT diam:     2.00 cm  LV E/e' lateral: 17.2 LV SV:         78 LV SV Index:   49 LVOT Area:     3.14 cm  RIGHT VENTRICLE RV Basal diam:  2.59 cm RV S prime:     16.20 cm/s TAPSE (M-mode): 2.6 cm LEFT ATRIUM             Index       RIGHT ATRIUM           Index LA diam:        3.80 cm 2.41 cm/m  RA Area:     12.80 cm LA Vol (A2C):   20.8 ml 13.19 ml/m RA Volume:   27.10 ml  17.19 ml/m LA Vol (A4C):   34.3 ml 21.75 ml/m LA Biplane Vol: 28.5 ml 18.07 ml/m  AORTIC VALVE                   PULMONIC VALVE AV Area (Vmax):    2.52 cm    PV Vmax:        0.96 m/s AV Area (Vmean):   2.56 cm    PV Peak grad:   3.6 mmHg AV Area (VTI):     2.60 cm    RVOT Peak grad: 5 mmHg AV Vmax:           142.00 cm/s AV Vmean:          93.700 cm/s AV VTI:            0.299 m AV Peak Grad:      8.1 mmHg AV Mean Grad:      4.0 mmHg LVOT Vmax:         114.00 cm/s LVOT Vmean:        76.500 cm/s LVOT VTI:          0.247 m LVOT/AV VTI ratio: 0.83  AORTA Ao Root diam: 3.10 cm MITRAL VALVE                TRICUSPID VALVE MV Area (PHT): 3.07 cm     TR Peak grad:   41.7 mmHg MV Decel Time: 247 msec     TR Vmax:        323.00 cm/s MV E velocity: 87.80 cm/s MV A velocity: 120.00 cm/s  SHUNTS MV E/A ratio:  0.73         Systemic VTI:  0.25 m                             Systemic Diam: 2.00 cm Areyana Rogue MD Electronically signed by Azariyah Rogue MD Signature Date/Time: 01/21/2021/7:58:01 PM    Final  Scheduled Meds: . sodium chloride   Intravenous Once  . Chlorhexidine Gluconate Cloth  6 each Topical Once  . docusate sodium  100 mg Oral BID  . donepezil  10 mg Oral QPM  . oxybutynin  5 mg Oral BID  . pantoprazole  40 mg Oral q AM   Continuous Infusions:   LOS: 5 days    Time spent: 42 minutes    Sharen Hones, MD [facility]   To contact the attending provider between 7A-7P or the covering provider during after hours 7P-7A,  please log into the web site [email or web address] and access using universal [place] password for that web site. If you do not have the password, please call the hospital operator.  01/22/2021, 9:35 AM

## 2021-01-23 DIAGNOSIS — I10 Essential (primary) hypertension: Secondary | ICD-10-CM | POA: Diagnosis not present

## 2021-01-23 DIAGNOSIS — S72491D Other fracture of lower end of right femur, subsequent encounter for closed fracture with routine healing: Secondary | ICD-10-CM | POA: Diagnosis not present

## 2021-01-23 MED ORDER — KCL IN DEXTROSE-NACL 20-5-0.9 MEQ/L-%-% IV SOLN
INTRAVENOUS | Status: DC
  Filled 2021-01-23 (×5): qty 1000

## 2021-01-23 MED ORDER — CLONIDINE HCL 0.2 MG/24HR TD PTWK
0.2000 mg | MEDICATED_PATCH | TRANSDERMAL | Status: DC
  Administered 2021-01-23: 19:00:00 0.2 mg via TRANSDERMAL
  Filled 2021-01-23: qty 1

## 2021-01-23 NOTE — Evaluation (Signed)
Clinical/Bedside Swallow Evaluation Patient Details  Name: Crystal Burch MRN: [id number] Date of Birth: 29-Sep-1935  Today's Date: 01/23/2021 Time: SLP Start Time (ACUTE ONLY): 58 SLP Stop Time (ACUTE ONLY): 1250 SLP Time Calculation (min) (ACUTE ONLY): 80 min  Past Medical History:  Past Medical History:  Diagnosis Date  . Dementia ([place])   . Hypertension    Past Surgical History:  Past Surgical History:  Procedure Laterality Date  . ABDOMINAL HYSTERECTOMY    . ORIF PERIPROSTHETIC FRACTURE Right 01/18/2021   Procedure: OPEN REDUCTION INTERNAL FIXATION (ORIF) PERIPROSTHETIC FRACTURE;  Surgeon: Hessie Knows, MD;  Location: [facility] ORS;  Service: Orthopedics;  Laterality: Right;   HPI:  Pt is a 85 y.o. 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  She presented to the hospital because of right lower extremity pain after a mechanical fall.     She was found to have right periprosthetic fracture of the femur.  She was treated with analgesics.  Orthopedic surgeon was consulted to assist with management.  She underwent ORIF periprosthetic fracture (right) on 01/18/2021.  On 5/18,  patient became unresponsive essentially, CT scan showed new low densities in the right cerebellar hemisphere, right parietal cortex and left occipital lobe concerning for acute  infarction. On 5/20, patient's mental status not improving.  Discussed with Neurology who felt patient has a very large burden of stroke, mental status unlikely to improve.  She also developed a high fever this morning, most likely due to aspiration pneumonia.  CXR on 01/22/2021: "No edema or airspace opacity. Cardiac silhouette within normal  limits.".   Assessment /  Plan / Recommendation Clinical Impression  Pt appears to present w/ risk for oropharyngeal phase dysphagia in light of declined Cognititve status as well recent Mod+ CVA w/ significant impact per Neurology. However w/ consistencies given at this assessment, No overt oropharyngeal phase dysphagia noted, No gross neuromuscular deficits noted during bolus management/swallowig. Pt consumed the po trials w/ No overt, clinical s/s of aspiration during them. W/ pt's deficits, there is potential impact on overall awareness/engagement and safety during po tasks which increases risk for aspiration, choking. Pt appears at reduced risk for aspiration following general aspiration precautions, using a modified diet including Nectar consistency liquids via TSP, and given feeding support w/ careful montioring w/ po's. During po trials, pt consumed the consistencies w/ no overt coughing, decline in vocal quality, or change in respiratory presentation during/post trials. Oral phase appeared [facility] w/ grossly timely bolus management and control of bolus propulsion for A-P transfer for swallowing. Oral clearing achieved w/ all trial consistencies given Time for any extra lingual sweeping and swallow. No solids were assessed. OM Exam was cursory but appeared [facility] during bolus management w/ no unilateral lingual weakness noted; slight decreased Left labial tone but no leakage occurred w/ trials. Speech mumbled but Clear. Pt exhibited Confusion during oral care w/ swabs but calmed when given verbal cues/encouragement. Pt required full feeding support. Min-Mod verbal/tactile/visual cueing given during po trials to engage pt's attention to po tasks but overall fair-good follow through w/ cues. Family agreed.       Recommend a Puree consistency diet w/ gravies to moisten foods; Nectar liquids - VIA TSP. Recommend aspiration precautions, Pills CRUSHED in Puree for safer, easier swallowing. Education given on Pills in  Puree; food consistencies  and easy to eat options; general aspiration precautions. Education, modeling, and hands-on practice given to Family on diet consistency, feeding w/ TSP, and aspiration precautions. Precautions posted and given to family. May have Single ice chips for pleasure in between meal post oral care -- PRN w/ Supervision. NSG and MD updated. Recommend Dietician f/u for supplement support. ST to f/u. SLP Visit Diagnosis: Dysphagia, oropharyngeal phase (R13.12) (new Stroke; baseline Dementia)    Aspiration Risk  Mild aspiration risk;Risk for inadequate nutrition/hydration (reduced following precautions)    Diet Recommendation  Dysphagia level 1 (puree) w/ gravies; NECTAR consistency liquids. Aspiration precautions. Feeding support and monitoring w/ po's for oral clearing/swallowing.  May have Single ice chips for pleasure in between meal post oral care -- PRN w/ Supervision.  Medication Administration: Crushed with puree    Other  Recommendations Recommended Consults:  (Dietician f/u; Palliative Care f/u) Oral Care Recommendations: Oral care BID;Oral care before and after PO;Staff/trained caregiver to provide oral care (took Dentures out) Other Recommendations: Order thickener from pharmacy;Prohibited food (jello, ice cream, thin soups);Remove water pitcher;Have oral suction available   Follow up Recommendations Skilled Nursing facility      Frequency and Duration min 3x week  2 weeks       Prognosis Prognosis for Safe Diet Advancement: Guarded (-Fair) Barriers to Reach Goals: Cognitive deficits;Time post onset;Severity of deficits (new CVA)      Swallow Study   General Date of Onset: 01/17/21 HPI: Pt is a 85 y.o. female with medical history significant for hypertension, hyperlipidemia, neuropathy, depression, anxiety, dementia, Charcot-Marie-Tooth disease, s/p right total knee replacement in July 2016, s/p right total hip replacement in August 2013 with compression in 2017, lateral [name], history  of lacunar stroke, history of meningioma.  She presented to the hospital because of right lower extremity pain after a mechanical fall.     She was found to have right periprosthetic fracture of the femur.  She was treated with analgesics.  Orthopedic surgeon was consulted to assist with management.  She underwent ORIF periprosthetic fracture (right) on 01/18/2021.  On 5/18,  patient became unresponsive essentially, CT scan showed new low densities in the right cerebellar hemisphere, right parietal cortex and left occipital lobe concerning for acute  infarction. On 5/20, patient's mental status not improving.  Discussed with Neurology who felt patient has a very large burden of stroke, mental status unlikely to improve.  She also developed a high fever this morning, most likely due to aspiration pneumonia.  CXR on 01/22/2021: "No edema or airspace opacity. Cardiac silhouette within normal  limits.". Type of Study: Bedside Swallow Evaluation Previous Swallow Assessment: none Diet Prior to this Study: NPO Temperature Spikes Noted: No (wbc 8.7 - has not been elevated per chart) Respiratory Status: Room air History of Recent Intubation: No Behavior/Cognition: Alert;Cooperative;Pleasant mood;Requires cueing;Distractible;Confused Oral Cavity Assessment: Dry;Dried secretions (sticky) Oral Care Completed by SLP: Yes (Dentures removed) Oral Cavity - Dentition: Dentures, top;Dentures, bottom (removed) Vision:  (n/a) Self-Feeding Abilities: Total assist Patient Positioning: Upright in bed (needed positioning) Baseline Vocal Quality: Low vocal intensity (mumbled, clear) Volitional Cough: Cognitively unable to elicit Volitional Swallow: Unable to elicit    Oral/Motor/Sensory Function Overall Oral Motor/Sensory Function: Mild impairment (-slight) Facial ROM: Reduced left;Suspected CN VII (facial) dysfunction Facial Symmetry: Abnormal symmetry left;Suspected CN VII (facial) dysfunction Facial Strength: Within  Functional Limits (Fair+) Lingual ROM: Within Functional Limits (appeared) Lingual Symmetry: Within Functional Limits (appeared) Lingual Strength: Within Functional Limits   [employer]  chips: Within functional limits Presentation: Spoon (fed; 6 trials) Other Comments: good lingual manipulation and response to the chips   Thin Liquid Thin Liquid: Not tested    Nectar Thick Nectar Thick Liquid: Within functional limits Presentation: Spoon (fed; ~2-3 ozs total) Other Comments: w/ family practicing too   Honey Thick Honey Thick Liquid: Not tested   Puree Puree: Within functional limits Presentation: Spoon (fed; ~2 ozs) Other Comments: w/ family practicing too   Solid     Solid: Not tested Other Comments: d/t level of alertness; weakness        Orinda Kenner, [place], [employer] Speech Language Pathologist Rehab Services [phone] [facility] 01/23/2021,3:24 PM

## 2021-01-23 NOTE — Progress Notes (Signed)
PROGRESS NOTE    Crystal Burch  [id number] DOB: [birth year] DOA: 01/17/2021 PCP: [place]   Follow up on acute stroke. Brief Narrative:  Crystal Burch a 85 y.o.femalewith medical history significant for hypertension, hyperlipidemia, neuropathy, depression, anxiety, dementia, Charcot-Marie-Tooth disease, s/p right total knee replacement in July 2016, s/p right total hip replacement in August 2013 with compression in 2017, lateral [name], history of lacunar stroke, history of meningioma. She presented to the hospital because of right lower extremity pain after a mechanical fall.  She was found to have right periprosthetic fracture of the femur. She was treated with analgesics. Orthopedic surgeon was consulted to assist with management.She underwent ORIF periprosthetic fracture (right) on 01/18/2021.  5/18. Patient has become unresponsive essentially yesterday evening, CT scan showednew low densities in the right cerebellar hemisphere, right parietal cortex and left occipital lobe concerning for acute infarction. 5/20.  Patient mental status not improving.  Discussed with neurology, patient has a very large burden of stroke, mental status unlikely to improve.  She also developed a high fever this morning, most likely due to aspiration pneumonia. Had a long discussion with the patient son, patient condition is terminal, not possible to have any meaningful recovery.  Will transition to comfort care, may transfer to inpatient hospice when bed is available. 5/21.  Patient is able to wake up today, speech is slurred.    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  Acute  right-sided strokes. Encephalopathy secondary to stroke. Patient is able to wake up today, her speech is garbled.  She appears to have right arm weakness. Due to large burden of stroke, patient long-term prognosis still very poor.  However, patient seems to made some progress today.  I will cancel hospice transfer for now.  Continue comfort measure.  However, I will obtain speech therapy reeval patient.  Family still do not want a feeding tube at this time.  I will continue fluids for now, reassess by Monday to see how much recovery patient can have.  At that time, will make decision whether patient needs to go to inpatient hospice versus SNF with hospice. Continue IV fluids with added potassium.  #2.  Sepsis secondary to aspiration. Aspiration pneumonitis. X-ray does not show any form of pneumonia.  Patient no longer has any fever today.  Patient is waking up today, she may have better control of her saliva.  We will hold antibiotics for now.  #3.  Hypokalemia. Added potassium into IV fluids.  Recheck a BMP in the morning.  4.  Acute blood loss anemia. Right paresthetic fracture of the femur status post ORIF. Mechanical fall. Hemoglobin will be rechecked.  #5 dementia.  6.  Chronic thrombocytopenia.     DVT prophylaxis: None Code Status: DNR Family Communication: Son updated Disposition Plan:  .   Status is: Inpatient  Remains inpatient appropriate because:Inpatient level of care appropriate due to severity of illness   Dispo: The patient is from: Home              Anticipated d/c is to: To be determined.              Patient currently is not medically stable to  d/c.   Difficult to place patient No        I/O last 3 completed shifts: In: 0  Out: 750 [Urine:700; Drains:50] No intake/output data recorded.     Consultants:   Orthopedics and neurology  Procedures: ORIF  Antimicrobials: None  Subjective: Patient woke up this morning, she tried to speak,  speech is slurred. She has disorientation. She does not have any fever or chills today. No short of breath or cough, no hypoxia. No dysuria hematuria No chest pain or palpitation.  Objective: Vitals:   01/22/21 0715 01/22/21 1200 01/22/21 2209 01/23/21 0607  BP: (!) 178/75  (!) 170/64 (!) 160/77  Pulse: 81  80 86  Resp: 16  17 16   Temp: 100.2 F (37.9 C) 98.3 F (36.8 C) 98.7 F (37.1 C) 98.5 F (36.9 C)  TempSrc: Axillary Axillary Axillary Oral  SpO2: 97%  97% 98%  Weight:      Height:        Intake/Output Summary (Last 24 hours) at 01/23/2021 0957 Last data filed at 01/22/2021 1500 Gross per 24 hour  Intake 0 ml  Output 300 ml  Net -300 ml   Filed Weights   01/17/21 1121  Weight: 61 kg    Examination:  General exam: Appears calm and comfortable  Respiratory system: Clear to auscultation. Respiratory effort normal. Cardiovascular system: S1 & S2 heard, RRR. No JVD, murmurs, rubs, gallops or clicks. No pedal edema. Gastrointestinal system: Abdomen is nondistended, soft and nontender. No organomegaly or masses felt. Normal bowel sounds heard. Central nervous system: Alert, disoriented to time, speech slurred. Extremities: Symmetric 5 x 5 power. Skin: No rashes, lesions or ulcers Psychiatry: Mood & affect appropriate.     Data Reviewed: I have personally reviewed following labs and imaging studies  CBC: Recent Labs  Lab 01/17/21 1112 01/18/21 0356 01/19/21 0559 01/20/21 0438 01/20/21 1623 01/21/21 0431 01/22/21 0612  WBC 7.0 6.9 8.8 6.2  --  7.2 8.7  NEUTROABS 4.4  --   --  4.0  --  5.5 6.7  HGB 12.4 10.0* 7.4* 6.8* 9.3* 9.5* 10.1*  HCT 39.0 30.6* 23.2* 21.0* 28.0* 28.4* 30.1*  MCV 97.5 96.2 98.7 96.8  --  92.5 91.5  PLT 122* 126* 118* 117*  --  123* [id number]*   Basic Metabolic Panel: Recent Labs  Lab 01/17/21 1112 01/18/21 0356 01/19/21 0559 01/21/21 0431 01/22/21 0612  NA 142 139 138 139 137  K 3.3* 4.0 4.0 3.3* 2.7*  CL 109 109 107 109 107  CO2  24 25 24 22  19*  GLUCOSE 127* 124* 167* 111* 85  BUN 21 21 23 17 14   CREATININE 0.67 0.65 0.84 0.52 0.49  CALCIUM 9.0 8.8* 8.5* 8.1* 8.0*  MG 2.0  --   --  2.0 1.9   GFR: Estimated Creatinine Clearance: 44.3 mL/min (by C-G formula based on SCr of 0.49 mg/dL). Liver Function Tests: No results for input(s): AST, ALT, ALKPHOS, BILITOT, PROT, ALBUMIN in the last 168 hours. No results for input(s): LIPASE, AMYLASE in the last 168 hours. No results for input(s): AMMONIA in the last 168 hours. Coagulation Profile: Recent Labs  Lab 01/17/21 1112  INR 1.0   Cardiac Enzymes: No results for input(s): CKTOTAL, CKMB, CKMBINDEX, TROPONINI in the last 168 hours. BNP (last 3 results) No results for input(s): PROBNP in the last 8760 hours. HbA1C: Recent Labs    01/21/21 0431  HGBA1C 5.6   CBG: Recent Labs  Lab 01/20/21 0545 01/20/21 0836  GLUCAP 108* 105*   Lipid Profile: Recent Labs    01/21/21 0431  CHOL 133  HDL 63  LDLCALC 58  TRIG 60  CHOLHDL 2.1   Thyroid Function Tests: No results for input(s): TSH, T4TOTAL, FREET4, T3FREE, THYROIDAB in the last 72 hours. Anemia Panel: No results for input(s): VITAMINB12, FOLATE, FERRITIN, TIBC, IRON, RETICCTPCT in the last 72 hours. Sepsis Labs: Recent Labs  Lab 01/22/21 0612  PROCALCITON <0.10    Recent Results (from the past 240 hour(s))  Resp Panel by RT-PCR (Flu A&B, Covid) Nasopharyngeal Swab     Status: None   Collection Time: 01/17/21 11:12 AM   Specimen: Nasopharyngeal Swab; Nasopharyngeal(NP) swabs in vial transport medium  Result Value Ref Range Status   SARS Coronavirus 2 by RT PCR NEGATIVE NEGATIVE Final    Comment: (NOTE) SARS-CoV-2 target nucleic acids are NOT DETECTED.  The SARS-CoV-2 RNA is generally detectable in upper respiratory specimens during the acute phase of infection. The lowest concentration of SARS-CoV-2 viral copies this assay can detect is 138 copies/mL. A negative result does not preclude  SARS-Cov-2 infection and should not be used as the sole basis for treatment or other patient management decisions. A negative result may occur with  improper specimen collection/handling, submission of specimen other than nasopharyngeal swab, presence of viral mutation(s) within the areas targeted by this assay, and inadequate number of viral copies(<138 copies/mL). A negative result must be combined with clinical observations, patient history, and epidemiological information. The expected result is Negative.  Fact Sheet for Patients:  EntrepreneurPulse.com.au  Fact Sheet for Healthcare Providers:  IncredibleEmployment.be  This test is no t yet approved or cleared by the [place] FDA and  has been authorized for detection and/or diagnosis of SARS-CoV-2 by FDA under an Emergency Use Authorization (EUA). This EUA will remain  in effect (meaning this test can be used) for the duration of the COVID-19 declaration under Section 564(b)(1) of the Act, 21 U.S.C.section 360bbb-3(b)(1), unless the authorization is terminated  or revoked sooner.       Influenza A by PCR NEGATIVE NEGATIVE Final   Influenza B by PCR NEGATIVE NEGATIVE Final    Comment: (NOTE) The Xpert Xpress SARS-CoV-2/FLU/RSV plus assay is intended as an aid in the diagnosis of influenza from Nasopharyngeal swab specimens and should not be used as a sole basis for treatment. Nasal washings and aspirates are unacceptable for Xpert Xpress SARS-CoV-2/FLU/RSV testing.  Fact Sheet for Patients: EntrepreneurPulse.com.au  Fact Sheet for Healthcare Providers: IncredibleEmployment.be  This test is not yet approved or cleared by the [place] FDA and has been authorized for detection and/or diagnosis of SARS-CoV-2 by FDA under an Emergency Use Authorization (EUA). This EUA will remain in effect (meaning this test can be used) for the duration of  the COVID-19 declaration under Section 564(b)(1) of the Act, 21 U.S.C. section 360bbb-3(b)(1), unless the authorization is terminated or revoked.  Performed at [facility], [address] [place], [place] [address]   CULTURE, BLOOD (ROUTINE X 2) w Reflex to ID Panel     Status: None (Preliminary result)   Collection Time: 01/22/21  8:01 AM   Specimen: BLOOD  Result Value Ref Range Status   Specimen Description BLOOD BLOOD LEFT HAND  Final   Special Requests   Final    BOTTLES DRAWN AEROBIC AND ANAEROBIC Blood Culture adequate volume   Culture   Final    NO GROWTH < 24 HOURS Performed at [facility], [place],  [place], [place] [address]    Report Status PENDING  Incomplete  CULTURE, BLOOD (ROUTINE X 2) w Reflex to ID Panel     Status: None (Preliminary result)   Collection Time: 01/22/21  8:56 AM   Specimen: BLOOD  Result Value Ref Range Status   Specimen Description BLOOD LEFT ANTECUBITAL  Final   Special Requests   Final    BOTTLES DRAWN AEROBIC AND ANAEROBIC Blood Culture adequate volume   Culture   Final    NO GROWTH < 24 HOURS Performed at [facility], [address], [place], [place] [address]    Report Status PENDING  Incomplete         Radiology Studies: DG Chest 1 View  Result Date: 01/22/2021 CLINICAL DATA:  Shortness of breath with concern for aspiration EXAM: CHEST  1 VIEW COMPARISON:  Jan 17, 2021 FINDINGS: Lungs are clear. Heart size and pulmonary vascularity are within normal limits. No adenopathy. No pneumothorax. No bone lesions. IMPRESSION: No edema or airspace opacity. Cardiac silhouette within normal limits. Electronically Signed   By: Lowella Grip III M.D.   On: 01/22/2021 10:01   CT HEAD WO CONTRAST  Result Date: 01/21/2021 CLINICAL DATA:  Stroke, follow-up. EXAM: CT HEAD WITHOUT CONTRAST TECHNIQUE: Contiguous axial images were obtained from the base of the skull through the vertex without intravenous  contrast. COMPARISON:  MRI/MRA head 01/20/2021. FINDINGS: Brain: Cerebral volume is normal. Known acute/early subacute infarcts within the cortical/subcortical bilateral cerebral hemispheres, basal ganglia, corpus callosum, pons and bilateral cerebellar hemispheres. The infarcts have not appreciably increased in extent as compared to the brain MRI of 01/20/2021. As before, the largest infarcts are present within the left parietal lobe, left temporal occipital lobes, posterior right temporal lobe and right cerebellum. No evidence of hemorrhagic conversion. Mild local mass effect associated with some of the infarcts. No midline shift or ventricular entrapment. Stable background advanced cerebral white matter chronic small vessel ischemic disease. No extra-axial fluid collection. No evidence of intracranial mass. Vascular: No hyperdense vessel.  Atherosclerotic calcifications. Skull: Normal. Negative for fracture or focal lesion. Sinuses/Orbits: Visualized orbits show no acute finding. Mild bilateral ethmoid and left maxillary sinus mucosal thickening. IMPRESSION: Multifocal acute/early subacute supratentorial and infratentorial infarcts, not appreciably changed in extent as compared to the brain MRI of 01/20/2021. No evidence of hemorrhagic conversion. Mild local mass effect associated with some of the infarcts, without midline shift or ventricular entrapment. Stable background severe cerebral white matter chronic small vessel ischemic disease. Electronically Signed   By: Kellie Simmering DO   On: 01/21/2021 13:10   ECHOCARDIOGRAM COMPLETE  Result Date: 01/21/2021    ECHOCARDIOGRAM REPORT   Patient Name:   TEGAN BRITAIN Dalton Date of Exam: 01/21/2021 Medical Rec #:  [id number]    Height:       60.0 in Accession #:    [id number]   Weight:       134.5 lb Date of Birth:  Oct 07, 1935   BSA:          1.577 m Patient Age:    70 years     BP:           163/99 mmHg Patient Gender: F            HR:           96 bpm. Exam Location:   [facility] Procedure: 2D Echo, Cardiac Doppler and Color Doppler Indications:     Stroke I63.9  History:  Patient has prior history of Echocardiogram examinations, most                  recent 04/14/2019. Risk Factors:Hypertension.  Sonographer:     Sherrie Sport RDCS (AE) Referring Phys:  [id number] Sharen Hones Diagnosing Phys: Elycia Rogue MD  Sonographer Comments: Suboptimal apical window. IMPRESSIONS  1. Left ventricular ejection fraction, by estimation, is 60 to 65%. The left ventricle has normal function. The left ventricle has no regional wall motion abnormalities. Left ventricular diastolic parameters are consistent with Grade I diastolic dysfunction (impaired relaxation).  2. Right ventricular systolic function is normal. The right ventricular size is normal. There is moderately elevated pulmonary artery systolic pressure. The estimated right ventricular systolic pressure is 123456 mmHg.  3. Mild to moderate sized pericardial effusion.  4. The mitral valve is normal in structure. Mild mitral valve regurgitation. FINDINGS  Left Ventricle: Left ventricular ejection fraction, by estimation, is 60 to 65%. The left ventricle has normal function. The left ventricle has no regional wall motion abnormalities. The left ventricular internal cavity size was normal in size. There is  no left ventricular hypertrophy. Left ventricular diastolic parameters are consistent with Grade I diastolic dysfunction (impaired relaxation). Right Ventricle: The right ventricular size is normal. No increase in right ventricular wall thickness. Right ventricular systolic function is normal. There is moderately elevated pulmonary artery systolic pressure. The tricuspid regurgitant velocity is 3.23 m/s, and with an assumed right atrial pressure of 5 mmHg, the estimated right ventricular systolic pressure is 123456 mmHg. Left Atrium: Left atrial size was normal in size. Right Atrium: Right atrial size was normal in size. Pericardium: A moderately  sized pericardial effusion is present. Mitral Valve: The mitral valve is normal in structure. Mild mitral valve regurgitation. No evidence of mitral valve stenosis. Tricuspid Valve: The tricuspid valve is normal in structure. Tricuspid valve regurgitation is mild . No evidence of tricuspid stenosis. Aortic Valve: The aortic valve is normal in structure. Aortic valve regurgitation is not visualized. No aortic stenosis is present. Aortic valve mean gradient measures 4.0 mmHg. Aortic valve peak gradient measures 8.1 mmHg. Aortic valve area, by VTI measures 2.60 cm. Pulmonic Valve: The pulmonic valve was normal in structure. Pulmonic valve regurgitation is not visualized. No evidence of pulmonic stenosis. Aorta: The aortic root is normal in size and structure. Venous: The inferior vena cava is normal in size with greater than 50% respiratory variability, suggesting right atrial pressure of 3 mmHg. IAS/Shunts: No atrial level shunt detected by color flow Doppler.  LEFT VENTRICLE PLAX 2D LVIDd:         3.70 cm  Diastology LVIDs:         2.25 cm  LV e' medial:    4.90 cm/s LV PW:         1.38 cm  LV E/e' medial:  17.9 LV IVS:        0.80 cm  LV e' lateral:   5.11 cm/s LVOT diam:     2.00 cm  LV E/e' lateral: 17.2 LV SV:         78 LV SV Index:   49 LVOT Area:     3.14 cm  RIGHT VENTRICLE RV Basal diam:  2.59 cm RV S prime:     16.20 cm/s TAPSE (M-mode): 2.6 cm LEFT ATRIUM             Index       RIGHT ATRIUM  Index LA diam:        3.80 cm 2.41 cm/m  RA Area:     12.80 cm LA Vol (A2C):   20.8 ml 13.19 ml/m RA Volume:   27.10 ml  17.19 ml/m LA Vol (A4C):   34.3 ml 21.75 ml/m LA Biplane Vol: 28.5 ml 18.07 ml/m  AORTIC VALVE                   PULMONIC VALVE AV Area (Vmax):    2.52 cm    PV Vmax:        0.96 m/s AV Area (Vmean):   2.56 cm    PV Peak grad:   3.6 mmHg AV Area (VTI):     2.60 cm    RVOT Peak grad: 5 mmHg AV Vmax:           142.00 cm/s AV Vmean:          93.700 cm/s AV VTI:            0.299 m AV  Peak Grad:      8.1 mmHg AV Mean Grad:      4.0 mmHg LVOT Vmax:         114.00 cm/s LVOT Vmean:        76.500 cm/s LVOT VTI:          0.247 m LVOT/AV VTI ratio: 0.83  AORTA Ao Root diam: 3.10 cm MITRAL VALVE                TRICUSPID VALVE MV Area (PHT): 3.07 cm     TR Peak grad:   41.7 mmHg MV Decel Time: 247 msec     TR Vmax:        323.00 cm/s MV E velocity: 87.80 cm/s MV A velocity: 120.00 cm/s  SHUNTS MV E/A ratio:  0.73         Systemic VTI:  0.25 m                             Systemic Diam: 2.00 cm Kayanna Rogue MD Electronically signed by Josephene Rogue MD Signature Date/Time: 01/21/2021/7:58:01 PM    Final         Scheduled Meds: . sodium chloride   Intravenous Once  . Chlorhexidine Gluconate Cloth  6 each Topical Once  . docusate sodium  100 mg Oral BID  . donepezil  10 mg Oral QPM  . oxybutynin  5 mg Oral BID  . pantoprazole  40 mg Oral q AM   Continuous Infusions: . dextrose 5 % and 0.9 % NaCl with KCl 20 mEq/L       LOS: 6 days    Time spent: 32 minutes    Sharen Hones, MD [facility]   To contact the attending provider between 7A-7P or the covering provider during after hours 7P-7A, please log into the web site [email or web address] and access using universal [place] password for that web site. If you do not have the password, please call the hospital operator.  01/23/2021, 9:57 AM

## 2021-01-24 DIAGNOSIS — I639 Cerebral infarction, unspecified: Secondary | ICD-10-CM | POA: Diagnosis not present

## 2021-01-24 LAB — CBC WITH DIFFERENTIAL/PLATELET
Abs Immature Granulocytes: 0.08 10*3/uL — ABNORMAL HIGH (ref 0.00–0.07)
Basophils Absolute: 0 10*3/uL (ref 0.0–0.1)
Basophils Relative: 0 %
Eosinophils Absolute: 0 10*3/uL (ref 0.0–0.5)
Eosinophils Relative: 0 %
HCT: 29.8 % — ABNORMAL LOW (ref 36.0–46.0)
Hemoglobin: 10.1 g/dL — ABNORMAL LOW (ref 12.0–15.0)
Immature Granulocytes: 1 %
Lymphocytes Relative: 9 %
Lymphs Abs: 0.8 10*3/uL (ref 0.7–4.0)
MCH: 30.8 pg (ref 26.0–34.0)
MCHC: 33.9 g/dL (ref 30.0–36.0)
MCV: 90.9 fL (ref 80.0–100.0)
Monocytes Absolute: 0.9 10*3/uL (ref 0.1–1.0)
Monocytes Relative: 11 %
Neutro Abs: 6.5 10*3/uL (ref 1.7–7.7)
Neutrophils Relative %: 79 %
Platelets: 169 10*3/uL (ref 150–400)
RBC: 3.28 MIL/uL — ABNORMAL LOW (ref 3.87–5.11)
RDW: 15.5 % (ref 11.5–15.5)
WBC: 8.3 10*3/uL (ref 4.0–10.5)
nRBC: 0 % (ref 0.0–0.2)

## 2021-01-24 LAB — BASIC METABOLIC PANEL
Anion gap: 7 (ref 5–15)
BUN: 11 mg/dL (ref 8–23)
CO2: 22 mmol/L (ref 22–32)
Calcium: 8.3 mg/dL — ABNORMAL LOW (ref 8.9–10.3)
Chloride: 114 mmol/L — ABNORMAL HIGH (ref 98–111)
Creatinine, Ser: 0.43 mg/dL — ABNORMAL LOW (ref 0.44–1.00)
GFR, Estimated: >60 mL/min (ref >60)
Glucose, Bld: 185 mg/dL — ABNORMAL HIGH (ref 70–99)
Potassium: 2.9 mmol/L — ABNORMAL LOW (ref 3.5–5.1)
Sodium: 143 mmol/L (ref 135–145)

## 2021-01-24 LAB — MAGNESIUM: Magnesium: 1.8 mg/dL (ref 1.7–2.4)

## 2021-01-24 MED ORDER — POTASSIUM CHLORIDE 10 MEQ/100ML IV SOLN
10.0000 meq | INTRAVENOUS | Status: AC
  Administered 2021-01-24: 13:00:00 10 meq via INTRAVENOUS
  Filled 2021-01-24 (×4): qty 100

## 2021-01-24 NOTE — Progress Notes (Signed)
Pt without IV access.  IVF and Potassium infusions due. Family requested not to access IV.

## 2021-01-24 NOTE — Progress Notes (Addendum)
Subjective: Per family, the patient is somewhat worse than she was yesterday.   Objective: Current vital signs: BP (!) 193/99   Pulse 91   Temp 97.8 F (36.6 C)   Resp 20   Ht 5' (1.524 m)   Wt 61 kg   SpO2 100%   BMI 26.26 kg/m  Vital signs in last 24 hours: Temp:  [97.8 F (36.6 C)] 97.8 F (36.6 C) (05/21 2240) Pulse Rate:  [82-91] 91 (05/22 0640) Resp:  [18-20] 20 (05/21 2240) BP: (175-193)/(74-99) 193/99 (05/22 0640) SpO2:  [99 %-100 %] 100 % (05/21 2240)  Intake/Output from previous day: 05/21 0701 - 05/22 0700 In: 901.2 [I.V.:901.2] Out: 1000 [Urine:1000] Intake/Output this shift: Total I/O In: -  Out: 700 [Urine:700] Nutritional status:  Diet Order            DIET - DYS 1 Room service appropriate? Yes with Assist; Fluid consistency: Nectar Thick  Diet effective now                HEENT: [place]/AT Lungs: Respirations unlabored  Neurologic Exam: Mental Status:Obtunded. Minimal eye opening to noxious stimuli. Not responding to any verbal commands, but will moan. No attempts to communicate. Moves RUE semipurposefully and weakly to brow ridge pressure.Flexes weakly at knees with noxious plantar stimulation. Will grimace and furrow brow to noxious.  Cranial Nerves:. II, III,IV, VI: Does not fixate or track.  V,VII:Grimaces on right < left in response to noxious brow ridge pressure on the same side. VIII:Unable to assess IX,X:Unable to assess VO:HYWVPX to assess TGG:YIRSWN to assess. Motor/Sensory: Flaccid tone BUE, but did weakly move RUE semipurposefully to noxious. Decreased tone BLE with weak knee flexion bilaterally to noxious plantar stimulation.  Cerebellar/Gait:Unable to assess  Lab Results: Results for orders placed or performed during the hospital encounter of 01/17/21 (from the past 48 hour(s))  CULTURE, BLOOD (ROUTINE X 2) w Reflex to ID Panel     Status: None (Preliminary result)   Collection Time: 01/22/21  8:56 AM   Specimen: BLOOD   Result Value Ref Range   Specimen Description BLOOD LEFT ANTECUBITAL    Special Requests      BOTTLES DRAWN AEROBIC AND ANAEROBIC Blood Culture adequate volume   Culture      NO GROWTH 2 DAYS Performed at [facility], [address] [place], [place] [address]    Report Status PENDING   CBC with Differential/Platelet     Status: Abnormal   Collection Time: 01/24/21  6:03 AM  Result Value Ref Range   WBC 8.3 4.0 - 10.5 K/uL   RBC 3.28 (L) 3.87 - 5.11 MIL/uL   Hemoglobin 10.1 (L) 12.0 - 15.0 g/dL   HCT 29.8 (L) 36.0 - 46.0 %   MCV 90.9 80.0 - 100.0 fL   MCH 30.8 26.0 - 34.0 pg   MCHC 33.9 30.0 - 36.0 g/dL   RDW 15.5 11.5 - 15.5 %   Platelets 169 150 - 400 K/uL   nRBC 0.0 0.0 - 0.2 %   Neutrophils Relative % 79 %   Neutro Abs 6.5 1.7 - 7.7 K/uL   Lymphocytes Relative 9 %   Lymphs Abs 0.8 0.7 - 4.0 K/uL   Monocytes Relative 11 %   Monocytes Absolute 0.9 0.1 - 1.0 K/uL   Eosinophils Relative 0 %   Eosinophils Absolute 0.0 0.0 - 0.5 K/uL   Basophils Relative 0 %   Basophils Absolute 0.0 0.0 - 0.1 K/uL   Immature Granulocytes 1 %  Abs Immature Granulocytes 0.08 (H) 0.00 - 0.07 K/uL    Comment: Performed at [facility], [address] [place], [place] [address]  Basic metabolic panel     Status: Abnormal   Collection Time: 01/24/21  6:03 AM  Result Value Ref Range   Sodium 143 135 - 145 mmol/L   Potassium 2.9 (L) 3.5 - 5.1 mmol/L   Chloride 114 (H) 98 - 111 mmol/L   CO2 22 22 - 32 mmol/L   Glucose, Bld 185 (H) 70 - 99 mg/dL    Comment: Glucose reference range applies only to samples taken after fasting for at least 8 hours.   BUN 11 8 - 23 mg/dL   Creatinine, Ser 0.43 (L) 0.44 - 1.00 mg/dL   Calcium 8.3 (L) 8.9 - 10.3 mg/dL   GFR, Estimated >60 >60 mL/min    Comment: (NOTE) Calculated using the CKD-EPI Creatinine Equation (2021)    Anion gap 7 5 - 15    Comment: Performed at [facility], [address] [place], [place] [address]   Magnesium     Status: None   Collection Time: 01/24/21  6:03 AM  Result Value Ref Range   Magnesium 1.8 1.7 - 2.4 mg/dL    Comment: Performed at [facility], [address] [place], [place] [address]    Recent Results (from the past 240 hour(s))  Resp Panel by RT-PCR (Flu A&B, Covid) Nasopharyngeal Swab     Status: None   Collection Time: 01/17/21 11:12 AM   Specimen: Nasopharyngeal Swab; Nasopharyngeal(NP) swabs in vial transport medium  Result Value Ref Range Status   SARS Coronavirus 2 by RT PCR NEGATIVE NEGATIVE Final    Comment: (NOTE) SARS-CoV-2 target nucleic acids are NOT DETECTED.  The SARS-CoV-2 RNA is generally detectable in upper respiratory specimens during the acute phase of infection. The lowest concentration of SARS-CoV-2 viral copies this assay can detect is 138 copies/mL. A negative result does not preclude SARS-Cov-2 infection and should not be used as the sole basis for treatment or other patient management decisions. A negative result may occur with  improper specimen collection/handling, submission of specimen other than nasopharyngeal swab, presence of viral mutation(s) within the areas targeted by this assay, and inadequate number of viral copies(<138 copies/mL). A negative result must be combined with clinical observations, patient history, and epidemiological information. The expected result is Negative.  Fact Sheet for Patients:  EntrepreneurPulse.com.au  Fact Sheet for Healthcare Providers:  IncredibleEmployment.be  This test is no t yet approved or cleared by the [place] FDA and  has been authorized for detection and/or diagnosis of SARS-CoV-2 by FDA under an Emergency Use Authorization (EUA). This EUA will remain  in effect (meaning this test can be used) for the duration of the COVID-19 declaration under Section 564(b)(1) of the Act, 21 U.S.C.section 360bbb-3(b)(1), unless the authorization  is terminated  or revoked sooner.       Influenza A by PCR NEGATIVE NEGATIVE Final   Influenza B by PCR NEGATIVE NEGATIVE Final    Comment: (NOTE) The Xpert Xpress SARS-CoV-2/FLU/RSV plus assay is intended as an aid in the diagnosis of influenza from Nasopharyngeal swab specimens and should not be used as a sole basis for treatment. Nasal washings and aspirates are unacceptable for Xpert Xpress SARS-CoV-2/FLU/RSV testing.  Fact Sheet for Patients: EntrepreneurPulse.com.au  Fact Sheet for Healthcare Providers: IncredibleEmployment.be  This test is not yet approved or cleared by the [place] FDA and has been authorized for detection  and/or diagnosis of SARS-CoV-2 by FDA under an Emergency Use Authorization (EUA). This EUA will remain in effect (meaning this test can be used) for the duration of the COVID-19 declaration under Section 564(b)(1) of the Act, 21 U.S.C. section 360bbb-3(b)(1), unless the authorization is terminated or revoked.  Performed at [facility], [address] [place], [place] [address]   CULTURE, BLOOD (ROUTINE X 2) w Reflex to ID Panel     Status: None (Preliminary result)   Collection Time: 01/22/21  8:01 AM   Specimen: BLOOD  Result Value Ref Range Status   Specimen Description BLOOD BLOOD LEFT HAND  Final   Special Requests   Final    BOTTLES DRAWN AEROBIC AND ANAEROBIC Blood Culture adequate volume   Culture   Final    NO GROWTH 2 DAYS Performed at [facility], [address] [place], [place] [address]    Report Status PENDING  Incomplete  CULTURE, BLOOD (ROUTINE X 2) w Reflex to ID Panel     Status: None (Preliminary result)   Collection Time: 01/22/21  8:56 AM   Specimen: BLOOD  Result Value Ref Range Status   Specimen Description BLOOD LEFT ANTECUBITAL  Final   Special Requests   Final    BOTTLES DRAWN AEROBIC AND ANAEROBIC Blood Culture adequate volume   Culture   Final    NO  GROWTH 2 DAYS Performed at [facility], [address] [place],  [address]    Report Status PENDING  Incomplete    Lipid Panel No results for input(s): CHOL, TRIG, HDL, CHOLHDL, VLDL, LDLCALC in the last 72 hours.  Studies/Results: No results found.  Medications:  Scheduled: . cloNIDine  0.2 mg Transdermal Weekly  . docusate sodium  100 mg Oral BID  . donepezil  10 mg Oral QPM  . oxybutynin  5 mg Oral BID  . pantoprazole  40 mg Oral q AM   Continuous: . dextrose 5 % and 0.9 % NaCl with KCl 20 mEq/L 75 mL/hr at 01/23/21 2219    Assessment:84 y.o.femalewith a PMHx of HTN, HLD,Charcot-Marie-Tooth disease,neuropathy, prior lacunar stroke, meningioma, depression and dementia who initiallypresented to the hospital for evaluationof right lower extremity pain after a mechanical fall. She was diagnosed with a periprosthetic fracture of her right femur. Sheunderwent ORIFof theperiprosthetic fracture onMonday. The patient on RN checks overnight appeared to be resting comfortably, but did not awaken for a vital check this morning and Rapid Response was called. The patient did appear to have potential left sided droop with her mouth at rest, and upon stimulationshewould make noise but not open her eyes. CT head revealednew low densities in the right cerebellar hemisphere, right parietal cortex and left occipital lobe concerning forsubacute infarctions.The patient was out of the tPA time window at the time of the CT scan which showed the new strokes. She was started on ASA and Lipitor. 1. Exam revealsan obtunded patient with decreased tone in all 4 extremities. Obtundation is most likely secondary to the high stroke burden seen on MRI.However, she has exhibited mild improvement since exam at the time of initial consultation earlier this week.  2. MRI brain:Multifocal supratentorial and infratentorial areas of acute/subacute infarcts suggesting embolic event.The  largest areas of infarct involve the left occipital temporal region, left parietal lobe and posterior right temporal lobe. Hemosiderin deposits in the right frontal lobe, bilateral cerebellar and cerebral hemisphere, as well as basal ganglia, right thalamus and pons suggesting prior microhemorrhages. Moderate to advanced chronic microvascular  ischemic changes of the white matter. 3.MRA head:No intracranial large vessel occlusion. Findings consistent with intracranial atherosclerotic disease with focal areas of moderate stenosisinvolvingthe right A3 and A4/ACA segments and in the mid P2/PCA segment. 4. Carotid ultrasound:Moderate amount of right-sided atherosclerotic plaque results in borderline elevated peak systolic velocities within the right internal carotid artery which approach the 50% luminal narrowing range. Minimal amount of left-sided atherosclerotic plaque, not definitely resulting in a hemodynamically significant stenosis. 5.Stroke Risk Factors -HTN, HLDandprior lacunar stroke 6. Long term neurological outcome is likely to be poor given the patient's advanced age, dementia and high stroke burden seen on MRI. 7. TTE: Left ventricular ejection fraction, by estimation, is 60 to 65%. The left ventricle has normal function. The left ventricle has no regional wall motion abnormalities. Left ventricular diastolic parameters are consistent with Grade I diastolic dysfunction (impaired relaxation). No mural thrombus or valvular vegetation described in the report.   Recommendations: 1. Frequent neuro checks 2. Cardiac telemetry.  3. Prophylactic therapy-Continue ASA for now.  4. Family currently in the process of deciding goals of care  40 minutes spent in the evaluation and management of the patient today, including discussion of prognosis with family. > 50% of time was spent in counseling/discussion.    LOS: 7 days   @Electronically  signed: Dr. Kerney [place] 01/24/2021  8:42 AM

## 2021-01-24 NOTE — Progress Notes (Signed)
PROGRESS NOTE    Crystal Burch  BZJ:[id number] DOB: 08/26/1936 DOA: 01/17/2021 PCP: [employer], Nurse, learning disability complaint.  Altered mental status. Brief Narrative:  Crystal Burch a 85 y.o.femalewith medical history significant for hypertension, hyperlipidemia, neuropathy, depression, anxiety, dementia, Charcot-Marie-Tooth disease, s/p right total knee replacement in July 2016, s/p right total hip replacement in August 2013 with compression in 2017, lateral [name], history of lacunar stroke, history of meningioma. She presented to the hospital because of right lower extremity pain after a mechanical fall.  She was found to have right periprosthetic fracture of the femur. She was treated with analgesics. Orthopedic surgeon was consulted to assist with management.She underwent ORIF periprosthetic fracture (right) on 01/18/2021.  5/18. Patient has become unresponsive essentially yesterday evening, CT scan showednew low densities in the right cerebellar hemisphere, right parietal cortex and left occipital lobe concerning for acute infarction. 5/20.Patient mental status not improving. Discussed with neurology, patient has a very large burden of stroke, mental status unlikely to improve. She also developed a high fever this morning, most likely due to aspiration pneumonia. Had a long discussion with the patientson, patient condition is terminal, not possible to have any meaningful recovery. Will transition to comfort care, may transfer to inpatient hospice when bed is available. 5/21.  Patient is able to wake up today, speech is slurred.    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 Acute right-sided strokes. Encephalopathy secondary to stroke. Patient woke up yesterday, evaluated by speech therapy, she was cleared for dysphagia diet.  However, patient has not been eating.  Her mental status is waxing and waning.  She still has significant dysarthria. Discussed with patient son, Crystal Burch, due to large stroke burden, patient is unlikely to be functional given she survive the stroke. We will continue fluids, but do not accelerate treatment. Will continue comfort care, most likely transfer patient to inpatient hospice tomorrow.  #2 hypokalemia. Will supplement through IV.  3.  Sepsis secondary to aspiration pneumonitis. Aspiration pneumonitis. Patient did have a fever at 2 days ago, but x-ray did not show any consolidation, procalcitonin was less than 0.1.  Patient does not need antibiotics.  #4.  Acute blood loss anemia Right periprosthetic fracture of right femur status post ORIF. Mechanical fall.   5.  Dementia  6.  Chronic thrombocytopenia.    DVT prophylaxis: None Code Status: DNR Family Communication: None updated, discussion as above. Disposition Plan:  .   Status is: Inpatient  Remains inpatient appropriate because:Inpatient level of care appropriate due to severity of illness   Dispo: The patient is from: Home              Anticipated d/c is to: hospice              Patient currently is not medically stable to d/c.   Difficult to place patient No        I/O last 3 completed shifts: In: 901.2 [I.V.:901.2] Out: 1000 [Urine:1000] Total I/O In: -  Out: 700 [Urine:700]     Consultants:   Orthopedics  Procedures: Hip  Antimicrobials: None  Subjective: Spoke with nurse and patient sister, sister was there all night.  Overnight, patient was  able to talk, but her words were not understandable. She was cleared by speech therapy for dysphagia diet, but she has almost no p.o. intake, no appetite. She does not have any short  of breath or hypoxia. No fever or chill.   Objective: Vitals:   01/23/21 1721 01/23/21 1753 01/23/21 2240 01/24/21 0640  BP: (!) 175/74 (!) 190/77 (!) 187/98 (!) 193/99  Pulse: 86 82 88 91  Resp: 18  20   Temp: 97.8 F (36.6 C)  97.8 F (36.6 C)   TempSrc:      SpO2: 99%  100%   Weight:      Height:        Intake/Output Summary (Last 24 hours) at 01/24/2021 0945 Last data filed at 01/24/2021 0703 Gross per 24 hour  Intake 901.2 ml  Output 1700 ml  Net -798.8 ml   Filed Weights   01/17/21 1121  Weight: 61 kg    Examination:  General exam: Appears calm and comfortable  Respiratory system: Clear to auscultation. Respiratory effort normal. Cardiovascular system: S1 & S2 heard, RRR. No JVD, murmurs, rubs, gallops or clicks. No pedal edema. Gastrointestinal system: Abdomen is nondistended, soft and nontender. No organomegaly or masses felt. Normal bowel sounds heard. Central nervous system: Unresponsive today, significant dysarthria. Extremities: Symmetric 5 x 5 power. Skin: No rashes, lesions or ulcers      Data Reviewed: I have personally reviewed following labs and imaging studies  CBC: Recent Labs  Lab 01/17/21 1112 01/18/21 0356 01/19/21 0559 01/20/21 0438 01/20/21 1623 01/21/21 0431 01/22/21 0612 01/24/21 0603  WBC 7.0   < > 8.8 6.2  --  7.2 8.7 8.3  NEUTROABS 4.4  --   --  4.0  --  5.5 6.7 6.5  HGB 12.4   < > 7.4* 6.8* 9.3* 9.5* 10.1* 10.1*  HCT 39.0   < > 23.2* 21.0* 28.0* 28.4* 30.1* 29.8*  MCV 97.5   < > 98.7 96.8  --  92.5 91.5 90.9  PLT 122*   < > 118* 117*  --  123* 142* 169   < > = values in this interval not displayed.   Basic Metabolic Panel: Recent Labs  Lab 01/17/21 1112 01/18/21 0356 01/19/21 0559 01/21/21 0431 01/22/21 0612 01/24/21 0603  NA 142 139 138 139 137 143  K 3.3* 4.0 4.0 3.3* 2.7* 2.9*  CL 109 109 107 109 107 114*  CO2 24 25 24 22  19* 22  GLUCOSE 127* 124* 167* 111* 85 185*  BUN 21 21 23 17 14 11   CREATININE 0.67  0.65 0.84 0.52 0.49 0.43*  CALCIUM 9.0 8.8* 8.5* 8.1* 8.0* 8.3*  MG 2.0  --   --  2.0 1.9 1.8   GFR: Estimated Creatinine Clearance: 44.3 mL/min (A) (by C-G formula based on SCr of 0.43 mg/dL (L)). Liver Function Tests: No results for input(s): AST, ALT, ALKPHOS, BILITOT, PROT, ALBUMIN in the last 168 hours. No results for input(s): LIPASE, AMYLASE in the last 168 hours. No results for input(s): AMMONIA in the last 168 hours. Coagulation Profile: Recent Labs  Lab 01/17/21 1112  INR 1.0   Cardiac Enzymes: No results for input(s): CKTOTAL, CKMB, CKMBINDEX, TROPONINI in the last 168 hours. BNP (last 3 results) No results for input(s): PROBNP in the last 8760 hours. HbA1C: No results for input(s): HGBA1C in the last 72 hours. CBG: Recent Labs  Lab 01/20/21 0545 01/20/21 0836  GLUCAP 108* 105*   Lipid Profile: No results for input(s): CHOL, HDL,  LDLCALC, TRIG, CHOLHDL, LDLDIRECT in the last 72 hours. Thyroid Function Tests: No results for input(s): TSH, T4TOTAL, FREET4, T3FREE, THYROIDAB in the last 72 hours. Anemia Panel: No results for input(s): VITAMINB12, FOLATE, FERRITIN, TIBC, IRON, RETICCTPCT in the last 72 hours. Sepsis Labs: Recent Labs  Lab 01/22/21 0612  PROCALCITON <0.10    Recent Results (from the past 240 hour(s))  Resp Panel by RT-PCR (Flu A&B, Covid) Nasopharyngeal Swab     Status: None   Collection Time: 01/17/21 11:12 AM   Specimen: Nasopharyngeal Swab; Nasopharyngeal(NP) swabs in vial transport medium  Result Value Ref Range Status   SARS Coronavirus 2 by RT PCR NEGATIVE NEGATIVE Final    Comment: (NOTE) SARS-CoV-2 target nucleic acids are NOT DETECTED.  The SARS-CoV-2 RNA is generally detectable in upper respiratory specimens during the acute phase of infection. The lowest concentration of SARS-CoV-2 viral copies this assay can detect is 138 copies/mL. A negative result does not preclude SARS-Cov-2 infection and should not be used as the sole  basis for treatment or other patient management decisions. A negative result may occur with  improper specimen collection/handling, submission of specimen other than nasopharyngeal swab, presence of viral mutation(s) within the areas targeted by this assay, and inadequate number of viral copies(<138 copies/mL). A negative result must be combined with clinical observations, patient history, and epidemiological information. The expected result is Negative.  Fact Sheet for Patients:  EntrepreneurPulse.com.au  Fact Sheet for Healthcare Providers:  IncredibleEmployment.be  This test is no t yet approved or cleared by the [place] FDA and  has been authorized for detection and/or diagnosis of SARS-CoV-2 by FDA under an Emergency Use Authorization (EUA). This EUA will remain  in effect (meaning this test can be used) for the duration of the COVID-19 declaration under Section 564(b)(1) of the Act, 21 U.S.C.section 360bbb-3(b)(1), unless the authorization is terminated  or revoked sooner.       Influenza A by PCR NEGATIVE NEGATIVE Final   Influenza B by PCR NEGATIVE NEGATIVE Final    Comment: (NOTE) The Xpert Xpress SARS-CoV-2/FLU/RSV plus assay is intended as an aid in the diagnosis of influenza from Nasopharyngeal swab specimens and should not be used as a sole basis for treatment. Nasal washings and aspirates are unacceptable for Xpert Xpress SARS-CoV-2/FLU/RSV testing.  Fact Sheet for Patients: EntrepreneurPulse.com.au  Fact Sheet for Healthcare Providers: IncredibleEmployment.be  This test is not yet approved or cleared by the [place] FDA and has been authorized for detection and/or diagnosis of SARS-CoV-2 by FDA under an Emergency Use Authorization (EUA). This EUA will remain in effect (meaning this test can be used) for the duration of the COVID-19 declaration under Section 564(b)(1) of the Act,  21 U.S.C. section 360bbb-3(b)(1), unless the authorization is terminated or revoked.  Performed at [facility], [address] [place], [place] [address]   CULTURE, BLOOD (ROUTINE X 2) w Reflex to ID Panel     Status: None (Preliminary result)   Collection Time: 01/22/21  8:01 AM   Specimen: BLOOD  Result Value Ref Range Status   Specimen Description BLOOD BLOOD LEFT HAND  Final   Special Requests   Final    BOTTLES DRAWN AEROBIC AND ANAEROBIC Blood Culture adequate volume   Culture   Final    NO GROWTH 2 DAYS Performed at [facility], [address] [place], [place] [address]    Report Status PENDING  Incomplete  CULTURE, BLOOD (ROUTINE X 2) w Reflex to ID Panel  Status: None (Preliminary result)   Collection Time: 01/22/21  8:56 AM   Specimen: BLOOD  Result Value Ref Range Status   Specimen Description BLOOD LEFT ANTECUBITAL  Final   Special Requests   Final    BOTTLES DRAWN AEROBIC AND ANAEROBIC Blood Culture adequate volume   Culture   Final    NO GROWTH 2 DAYS Performed at [facility], [address], [place], [place] [address]    Report Status PENDING  Incomplete         Radiology Studies: No results found.      Scheduled Meds: . cloNIDine  0.2 mg Transdermal Weekly  . docusate sodium  100 mg Oral BID  . donepezil  10 mg Oral QPM  . oxybutynin  5 mg Oral BID  . pantoprazole  40 mg Oral q AM   Continuous Infusions: . dextrose 5 % and 0.9 % NaCl with KCl 20 mEq/L 75 mL/hr at 01/23/21 2219  . potassium chloride       LOS: 7 days    Time spent: 32 minutes, more than 50% of time involved in direct patient care.    Sharen Hones, MD [facility]   To contact the attending provider between 7A-7P or the covering provider during after hours 7P-7A, please log into the web site [email or web address] and access using universal [place] password for that web site. If you do not have the password, please call the hospital  operator.  01/24/2021, 9:45 AM

## 2021-01-25 DIAGNOSIS — D62 Acute posthemorrhagic anemia: Secondary | ICD-10-CM

## 2021-01-25 DIAGNOSIS — S72491D Other fracture of lower end of right femur, subsequent encounter for closed fracture with routine healing: Secondary | ICD-10-CM | POA: Diagnosis not present

## 2021-01-25 DIAGNOSIS — G9340 Encephalopathy, unspecified: Secondary | ICD-10-CM

## 2021-01-25 DIAGNOSIS — J690 Pneumonitis due to inhalation of food and vomit: Secondary | ICD-10-CM

## 2021-01-25 DIAGNOSIS — D696 Thrombocytopenia, unspecified: Secondary | ICD-10-CM

## 2021-01-25 NOTE — Progress Notes (Signed)
[facility] hospital liaison note:  Visited patient at bedside. Daughter in law Juliene Pina and son Marya Amsler present. Patient in bed, non responsive to voice or touch. No signs of discomfort or distress noted. Emotional support given. Unfortunately the [facility]osp[facility] home does not have a bed available today. Family aware. Hospital care team notified. Liaison will follow daily and update family and hospital care team regarding bed availability. Thank you. Flo Shanks BSN, RN, [place] [phone]

## 2021-01-25 NOTE — Progress Notes (Signed)
Neurology Family Discussion Note  I have taken over for Dr. Cheral Marker today on neurology. Dr. Cheral Marker signed out the patient to me verbally overnight and I also reviewed her records and actual images independently. She has a very large stroke burden seen on MRI including multifocal supratentorial and infratentorial areas of acute/subacute infarcts suggesting embolic event. This in combination with patient's baseline cognitive impairment 2/2 her dementia and her advanced age portend a very poor prognosis for a meaningful recovery. I spoke to her daughter in law at bedside to convey my impression and my concern that hospice would be the most appropriate course particularly given her inability to take meaningful po. She said that the family is now in agreement for hospice/comfort care only. The patient had a brief period of lucidity last night that enabled her to sign the DNAR which was the sign the family felt they needed to move forward with hospice. I told the family that given the situation, her doctors are all in agreement that hospice is the most appropriate and loving option for the patient going forward.   Neurology will not continue to actively follow, but I am available to have any additional family conversations that might be helpful and to support the family in any way that may be helpful going forward.  Su Monks, MD [facility] [phone]  If 7pm- 7am, please page neurology on call as listed in [place].

## 2021-01-25 NOTE — TOC Transition Note (Signed)
Transition of Care [facility]) - CM/SW Discharge Note   Patient Details  Name: AMARYS SLIWINSKI MRN: [id number] Date of Birth: Dec 28, 1935  [facility] CM/SW Contact:  Pete Pelt, RN Phone Number: 01/25/2021, 9:48 AM   Clinical Narrative:   TOC in to see patient and son at bedside, continues to wait for Hospice Home Bed.  TOC contact information given.  TOC will follow through discharge.      Barriers to Discharge: Other (must enter comment) (hospice home referal given to [facility])   Patient Goals and CMS Choice   CMS Medicare.gov Compare Post Acute Care list provided to:: Patient Represenative (must comment) Choice offered to / list presented to : Adult Children  Discharge Placement                       Discharge Plan and Services   Discharge Planning Services: CM Consult Post Acute Care Choice: Hospice          DME Arranged:  (n/a)         HH Arranged: NA          Social Determinants of Health (SDOH) Interventions     Readmission Risk Interventions No flowsheet data found.

## 2021-01-25 NOTE — Progress Notes (Signed)
Nutrition Brief Note  Chart reviewed. Pt now transitioned to comfort care and awaiting transfer to hospice house.  Would recommend allowing pt to consume foods on current diet order as she expresses interest to promote comfort. No further nutrition interventions planned at this time.  Please re-consult as needed.   Ranell Patrick, RD, LDN Clinical Dietitian Pager on [place]

## 2021-01-25 NOTE — Care Management Important Message (Signed)
Important Message  Patient Details  Name: CAMAURI FLEECE MRN: [id number] Date of Birth: 1936/03/06   Medicare Important Message Given:  Other (see comment)  Patient now comfort care.  Medicare IM withheld at this time.     Dannette Barbara 01/25/2021, 8:35 AM

## 2021-01-25 NOTE — Progress Notes (Signed)
PROGRESS NOTE    Crystal Burch  PJK:[id number] DOB: Aug 01, 1936 DOA: 01/17/2021 PCP: [place]    Brief Narrative:  Crystal Burch a 85 y.o.femalewith medical history significant for hypertension, hyperlipidemia, neuropathy, depression, anxiety, dementia, Charcot-Marie-Tooth disease, s/p right total knee replacement in July 2016, s/p right total hip replacement in August 2013 with compression in 2017, lateral Crystal Burch, history of lacunar stroke, history of meningioma. She presented to the hospital because of right lower extremity pain after a mechanical fall.  She was found to have right periprosthetic fracture of the femur. She was treated with analgesics. Orthopedic surgeon was consulted to assist with management.She underwent ORIF periprosthetic fracture (right) on 01/18/2021.  5/18. Patient has become unresponsive essentially yesterday evening, CT scan showednew low densities in the right cerebellar hemisphere, right parietal cortex and left occipital lobe concerning for acute infarction. 5/20.Patient mental status not improving. Discussed with neurology, patient has a very large burden of stroke, mental status unlikely to improve. She also developed a high fever this morning, most likely due to aspiration pneumonia. Had a long discussion with the patientson, patient condition is terminal, not possible to have any meaningful recovery. Will transition to comfort care, may transfer to inpatient hospice when bed is available. 5/21.Patient is able to wake up today, speech is slurred.   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 Quinn Burch, neurology,  patient is still appropriate for hospice care.  Patient has a very large stroke burden, not able to take any p.o.  Progress now very poor.  Discussed with patient POA on the phone, given patient poor prognosis, will continue comfort care, discontinue all active treatment.  Patient also been seen by hospice, plan to transfer to hospice inpatient when bed is available.      Subjective: Patient is opening eyes only, spoke with the nurse, patient has  no p.o. intake. She does not seem to have any short of breath. She is not in pain.   Objective: Vitals:   01/24/21 1036 01/24/21 1838 01/25/21 0521 01/25/21 1232  BP: (!) 194/92 (!) 171/84 (!) 170/77 (!) 167/107  Pulse: 92 90 85 91  Resp: 11 18 18 16   Temp: 99.4 F (37.4 C) 98.8 F (37.1 C) 99.5 F (37.5 C) 98.8 F (37.1 C)  TempSrc:  Oral    SpO2: 99% 98% 98% 98%  Weight:      Height:        Intake/Output Summary (Last 24 hours) at 01/25/2021 1253 Last data filed at 01/25/2021 1032 Gross per 24 hour  Intake 2074.94 ml  Output 1500 ml  Net 574.94 ml   Filed Weights   01/17/21 1121  Weight: 61 kg    Examination:  General exam: Ill-appearing, Respiratory system: Clear to auscultation. Respiratory effort normal. Cardiovascular system: S1 & S2 heard, RRR. No JVD, murmurs, rubs, gallops or clicks. No pedal edema. Gastrointestinal system: Abdomen is nondistended, soft and nontender. No organomegaly or masses felt. Normal bowel sounds heard. Central nervous system: Opening eyes only. Extremities: Symmetric 5 x 5 power. Skin: No rashes, lesions or ulcers      Data Reviewed: I have personally reviewed following labs and imaging studies  CBC: Recent Labs  Lab 01/19/21 0559 01/20/21 0438 01/20/21 1623 01/21/21 0431 01/22/21 0612 01/24/21 0603  WBC 8.8 6.2  --  7.2 8.7 8.3  NEUTROABS  --  4.0  --  5.5 6.7 6.5  HGB 7.4* 6.8* 9.3* 9.5* 10.1* 10.1*  HCT 23.2* 21.0* 28.0* 28.4* 30.1* 29.8*  MCV 98.7 96.8  --  92.5 91.5  90.9  PLT 118* 117*  --  123* 142* 564   Basic Metabolic Panel: Recent Labs  Lab 01/19/21 0559 01/21/21 0431 01/22/21 0612 01/24/21 0603  NA 138 139 137 143  K 4.0 3.3* 2.7* 2.9*  CL 107 109 107 114*  CO2 24 22 19* 22  GLUCOSE 167* 111* 85 185*  BUN 23 17 14 11   CREATININE 0.84 0.52 0.49 0.43*  CALCIUM 8.5* 8.1* 8.0* 8.3*  MG  --  2.0 1.9 1.8   GFR: Estimated Creatinine Clearance: 44.3 mL/min (A) (by C-G formula based on SCr of 0.43 mg/dL (L)). Liver Function Tests: No results for input(s): AST, ALT, ALKPHOS, BILITOT, PROT, ALBUMIN in the last 168 hours. No results for input(s): LIPASE, AMYLASE in the last 168 hours. No results for input(s): AMMONIA in the last 168 hours. Coagulation Profile: No results for input(s): INR, PROTIME in the last 168 hours. Cardiac Enzymes: No results for input(s): CKTOTAL, CKMB, CKMBINDEX, TROPONINI in the last 168 hours. BNP (last 3 results) No results for input(s): PROBNP in the last 8760 hours. HbA1C: No results for input(s): HGBA1C in the last 72 hours. CBG: Recent Labs  Lab 01/20/21 0545 01/20/21 0836  GLUCAP 108* 105*   Lipid Profile: No results for input(s): CHOL, HDL, LDLCALC, TRIG, CHOLHDL, LDLDIRECT in the last 72 hours. Thyroid Function Tests: No results for input(s): TSH, T4TOTAL, FREET4, T3FREE, THYROIDAB in the last 72 hours. Anemia Panel: No results for input(s): VITAMINB12, FOLATE, FERRITIN, TIBC, IRON, RETICCTPCT in the last 72 hours. Sepsis Labs: Recent Labs  Lab 01/22/21 0612  PROCALCITON <0.10    Recent Results (from the past 240 hour(s))  Resp Panel by RT-PCR (Flu A&B, Covid) Nasopharyngeal Swab     Status: None   Collection Time: 01/17/21 11:12 AM   Specimen: Nasopharyngeal Swab; Nasopharyngeal(NP) swabs in vial transport medium  Result Value Ref Range Status   SARS Coronavirus 2 by RT PCR NEGATIVE NEGATIVE Final    Comment: (NOTE) SARS-CoV-2 target nucleic acids are NOT DETECTED.  The SARS-CoV-2 RNA is  generally detectable in upper respiratory specimens during the acute phase of infection. The lowest concentration of SARS-CoV-2 viral copies this assay can detect is 138 copies/mL. A negative result does not preclude SARS-Cov-2 infection and should not be used as the sole basis for treatment or other patient management decisions. A negative result may occur with  improper specimen collection/handling, submission of specimen other than nasopharyngeal swab, presence of viral mutation(s) within the areas targeted by this assay, and inadequate number of viral copies(<138 copies/mL). A negative result must be combined with clinical observations, patient history, and epidemiological information. The expected result is Negative.  Fact Sheet for Patients:  EntrepreneurPulse.com.au  Fact Sheet for Healthcare Providers:  IncredibleEmployment.be  This test is no t yet approved or cleared by the [place] FDA and  has been authorized for detection and/or diagnosis of SARS-CoV-2 by FDA under an Emergency Use Authorization (EUA). This EUA will remain  in effect (meaning this test can be used) for the duration of the COVID-19 declaration under Section 564(b)(1) of the Act, 21 U.S.C.section 360bbb-3(b)(1), unless the authorization is terminated  or revoked sooner.       Influenza A by PCR NEGATIVE NEGATIVE Final   Influenza B by PCR NEGATIVE NEGATIVE Final    Comment: (NOTE) The Xpert Xpress SARS-CoV-2/FLU/RSV plus assay is intended as an aid in the diagnosis of influenza from Nasopharyngeal swab specimens and should not be used as a sole basis for treatment. Nasal washings and aspirates are unacceptable for Xpert Xpress SARS-CoV-2/FLU/RSV testing.  Fact Sheet for Patients: EntrepreneurPulse.com.au  Fact Sheet for Healthcare Providers: IncredibleEmployment.be  This test is not yet approved or cleared by the [place] FDA and has been authorized for detection and/or diagnosis of SARS-CoV-2 by FDA under an Emergency Use Authorization (EUA). This EUA will remain in effect (meaning this test can be used) for the duration of the COVID-19 declaration under Section 564(b)(1) of the Act, 21 U.S.C. section 360bbb-3(b)(1), unless the authorization is terminated or revoked.  Performed at [facility], [address] [place], [place] [address]   CULTURE, BLOOD (ROUTINE X 2) w Reflex to ID Panel     Status: None (Preliminary result)   Collection Time: 01/22/21  8:01 AM   Specimen: BLOOD  Result Value Ref Range Status   Specimen Description BLOOD BLOOD LEFT HAND  Final   Special Requests   Final    BOTTLES DRAWN AEROBIC AND ANAEROBIC Blood Culture adequate volume   Culture   Final    NO GROWTH 2 DAYS Performed at [facility], [address] [place], [place] [address]    Report Status PENDING  Incomplete  CULTURE, BLOOD (ROUTINE X 2) w Reflex to ID Panel     Status: None (Preliminary result)   Collection Time: 01/22/21  8:56 AM   Specimen: BLOOD  Result Value Ref Range Status   Specimen Description BLOOD LEFT ANTECUBITAL  Final   Special Requests   Final    BOTTLES DRAWN AEROBIC AND ANAEROBIC Blood Culture adequate volume   Culture   Final    NO GROWTH 2 DAYS Performed at [facility], [address] [place], [place] [address]    Report Status PENDING  Incomplete         Radiology Studies: No results found.      Scheduled Meds: . cloNIDine  0.2 mg Transdermal Weekly  . docusate sodium  100 mg Oral BID  . donepezil  10 mg Oral QPM  . oxybutynin  5 mg Oral BID  . pantoprazole  40 mg Oral q AM   Continuous Infusions:   LOS: 8 days    Time spent: 34 minutes, more than 50% time involved in direct patient care.    Sharen Hones, MD [facility]   To contact the attending provider between 7A-7P or the covering provider during after hours  7P-7A, please log into the web site [email or web address] and access using universal [place] password for that web site. If you do not have the password, please call the hospital operator.  01/25/2021, 12:53 PM

## 2021-01-26 DIAGNOSIS — S72491D Other fracture of lower end of right femur, subsequent encounter for closed fracture with routine healing: Secondary | ICD-10-CM | POA: Diagnosis not present

## 2021-01-26 DIAGNOSIS — J690 Pneumonitis due to inhalation of food and vomit: Secondary | ICD-10-CM | POA: Diagnosis not present

## 2021-01-26 MED ORDER — ACETAMINOPHEN 650 MG RE SUPP: 650.0000 mg | Freq: Four times a day (QID) | RECTAL | 0 refills | Status: AC | PRN

## 2021-01-26 MED ORDER — BISACODYL 10 MG RE SUPP
10.0000 mg | Freq: Every day | RECTAL | 0 refills | Status: AC | PRN
Start: 2021-01-26 — End: ?

## 2021-01-26 MED ORDER — MORPHINE SULFATE (CONCENTRATE) 10 MG/0.5ML PO SOLN: 5.0000 mg | ORAL | 0 refills | Status: AC | PRN

## 2021-01-26 MED ORDER — GLYCOPYRROLATE 0.2 MG/ML IJ SOLN: 0.2000 mg | INTRAMUSCULAR | Status: AC | PRN

## 2021-01-26 MED ORDER — HALOPERIDOL LACTATE 2 MG/ML PO CONC: 0.5000 mg | ORAL | 0 refills | Status: AC | PRN

## 2021-01-26 NOTE — Progress Notes (Signed)
[facility]) Hospital Liaison RN note:  [facility] does have a room to offer today. Hospital care team is aware. [place] Liaison has spoken with son, Ronalee Belts and he will docusign paperwork this morning. Transport has been arranged for 11:30. I have faxed the discharge summary to the Hospice Home.  Please call with any hospice related questions or concerns.  Thank you for the opportunity to participate in this patient's care.  Zandra Abts, RN [facility] Liaison  [phone]

## 2021-01-26 NOTE — Plan of Care (Signed)
  Problem: Education: Goal: Knowledge of General Education information will improve Description: Including pain rating scale, medication(s)/side effects and non-pharmacologic comfort measures Outcome: Progressing   Problem: Health Behavior/Discharge Planning: Goal: Ability to manage health-related needs will improve Outcome: Progressing   Problem: Clinical Measurements: Goal: Ability to maintain clinical measurements within normal limits will improve Outcome: Progressing Goal: Will remain free from infection Outcome: Progressing Goal: Diagnostic test results will improve Outcome: Progressing Goal: Respiratory complications will improve Outcome: Progressing Goal: Cardiovascular complication will be avoided Outcome: Progressing   Problem: Activity: Goal: Risk for activity intolerance will decrease Outcome: Progressing   Problem: Nutrition: Goal: Adequate nutrition will be maintained Outcome: Progressing   Problem: Coping: Goal: Level of anxiety will decrease Outcome: Progressing   Problem: Elimination: Goal: Will not experience complications related to bowel motility Outcome: Progressing Goal: Will not experience complications related to urinary retention Outcome: Progressing   Problem: Pain Managment: Goal: General experience of comfort will improve Outcome: Progressing   Problem: Safety: Goal: Ability to remain free from injury will improve Outcome: Progressing   Problem: Skin Integrity: Goal: Risk for impaired skin integrity will decrease Outcome: Progressing   Problem: Education: Goal: Knowledge of secondary prevention will improve Outcome: Progressing Goal: Knowledge of patient specific risk factors addressed and post discharge goals established will improve Outcome: Progressing   Problem: Coping: Goal: Will identify appropriate support needs Outcome: Progressing   Problem: Self-Care: Goal: Verbalization of feelings and concerns over difficulty with  self-care will improve Outcome: Progressing Goal: Ability to communicate needs accurately will improve Outcome: Progressing

## 2021-01-26 NOTE — Progress Notes (Signed)
SLP Cancellation Note  Patient Details Name: Crystal Burch MRN: [id number] DOB: 09-Apr-1936   Cancelled treatment:       Reason Eval/Treat Not Completed:  (decline in status; transition to comfort care) Per MD note, discussion with family members yesterday re: pt's status and decision was made to transition to comfort care status. Pt is scheduled to transfer to [facility], bed available today. ST services will sign off but available if any further needs.       Orinda Kenner, MS, CCC-SLP Speech Language Pathologist Rehab Services [phone] [facility] 01/26/2021, 9:22 AM

## 2021-01-26 NOTE — TOC Progression Note (Signed)
Transition of Care [facility]) - Progression Note    Patient Details  Name: Crystal Burch MRN: [id number] Date of Birth: 11-Feb-1936  Transition of Care [facility]) CM/SW [place], RN Phone Number: 01/26/2021, 8:45 AM  Clinical Narrative:   RNCM notified by Hospice Liaison that [place] has a bed available for patient today.  [facility] will transport patient at 1130am.  Hospice Liaison has notified family.  RNCM has notified care team.    Expected Discharge Plan: [place] Barriers to Discharge: Other (must enter comment) (hospice home referal given to [facility])  Expected Discharge Plan and Services Expected Discharge Plan: [place]   Discharge Planning Services: CM Consult Post Acute Care Choice: Hospice Living arrangements for the past 2 months: Single Family Home                 DME Arranged:  (n/a)         HH Arranged: NA           Social Determinants of Health (SDOH) Interventions    Readmission Risk Interventions No flowsheet data found.

## 2021-01-26 NOTE — Discharge Summary (Signed)
Physician Discharge Summary  Patient ID: Crystal Burch MRN: [id number] DOB/AGE: [birth year] 83 y.o.  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  Discharged Condition: poor  Hospital Course: Crystal Papin Melvinis a 85 y.o.femalewith medical history significant for hypertension, hyperlipidemia, neuropathy, depression, anxiety, dementia, Charcot-Marie-Tooth disease, s/p right total knee replacement in July 2016, s/p right total hip replacement in August 2013 with compression in 2017, lateral [name], history of lacunar stroke, history of meningioma. She presented to the hospital because of right lower extremity pain after a mechanical fall.  She was found to have right periprosthetic fracture of the femur. She was treated with analgesics. Orthopedic surgeon was consulted to assist with management.She underwent ORIF periprosthetic fracture (right) on 01/18/2021.  5/18. Patient has become unresponsive essentially yesterday evening, CT scan showednew low densities in the right cerebellar hemisphere, right parietal cortex and left occipital lobe concerning for acute infarction. 5/20.Patient mental status not improving. Discussed with neurology, patient has a very large burden of stroke, mental status unlikely to improve. She also developed a high fever this morning, most likely due to aspiration pneumonia. Had a long discussion with the patientson, patient condition is  terminal, not possible to have any meaningful recovery. Will transition to comfort care, may transfer to inpatient hospice when bed is available. 5/21.Patient is able to wake up today, speech is slurred. 5/24.  Patient still not to take any p.o., discussed with family members yesterday, decision was made to transfer to inpatient hospice, bed available today.    Consults: neurology and orthopedic surgery  Significant Diagnostic Studies:   Treatments: hip surgery, comfort care  Discharge Exam: Blood pressure (!) 159/71, pulse 93, temperature 99.8 F (37.7 C), temperature source Oral, resp. rate 18, height 5' (1.524 m), weight 61 kg, SpO2 98 %. General appearance: open eyes only,  Resp: clear to auscultation bilaterally Cardio: regular rate and rhythm, S1, S2 normal, no murmur, click, rub or gallop GI: soft, non-tender; bowel sounds normal; no masses,  no organomegaly Extremities: extremities normal, atraumatic, no cyanosis or edema  Disposition: Discharge disposition: 51-Hospice/Medical Facility       Discharge Instructions    Diet - low sodium heart healthy   Complete by: As directed    Discharge wound care:   Complete by: As directed    Follow with hospice   Increase activity slowly   Complete by: As directed      Allergies as of 01/26/2021      Reactions   Quinapril Nausea And Vomiting   UNKNOWN REACTION OR SEVERITY REPORTED BY PATIENT   Tramadol Hcl    Other reaction(s): ? itching, Pt called to report itching after taking 3 different doses June 2013, denies itching as of October 2013, med restarted      Medication List    STOP taking these medications   aspirin EC 81 MG tablet   atorvastatin 40 MG tablet Commonly known as: LIPITOR   cetirizine 10 MG tablet Commonly known as: ZYRTEC   citalopram 10 MG tablet Commonly  known as: CELEXA   Docusate Sodium 100 MG capsule   donepezil 10 MG tablet Commonly known as: ARICEPT   gabapentin 300 MG  capsule Commonly known as: NEURONTIN   lisinopril 5 MG tablet Commonly known as: ZESTRIL   melatonin 1 MG Tabs tablet   meloxicam 7.5 MG tablet Commonly known as: MOBIC   montelukast 10 MG tablet Commonly known as: SINGULAIR   omeprazole 20 MG capsule Commonly known as: PRILOSEC   oxybutynin 5 MG tablet Commonly known as: DITROPAN   ProAir HFA 108 (90 Base) MCG/ACT inhaler Generic drug: albuterol   tiZANidine 4 MG tablet Commonly known as: ZANAFLEX   traZODone 50 MG tablet Commonly known as: DESYREL   trospium 20 MG tablet Commonly known as: SANCTURA   Tylenol 325 MG tablet Generic drug: acetaminophen Replaced by: acetaminophen 650 MG suppository   vitamin B-12 1000 MCG tablet Commonly known as: CYANOCOBALAMIN     TAKE these medications   acetaminophen 650 MG suppository Commonly known as: TYLENOL Place 1 suppository (650 mg total) rectally every 6 (six) hours as needed for mild pain (or Fever >/= 101). Replaces: Tylenol 325 MG tablet   bisacodyl 10 MG suppository Commonly known as: DULCOLAX Place 1 suppository (10 mg total) rectally daily as needed for moderate constipation.   glycopyrrolate 0.2 MG/ML injection Commonly known as: ROBINUL Inject 1 mL (0.2 mg total) into the skin every 4 (four) hours as needed (excessive secretions).   haloperidol 2 MG/ML solution Commonly known as: HALDOL Place 0.3 mLs (0.6 mg total) under the tongue every 4 (four) hours as needed for agitation (or delirium).   morphine CONCENTRATE 10 MG/0.5ML Soln concentrated solution Take 0.25 mLs (5 mg total) by mouth every 2 (two) hours as needed for moderate pain (or dyspnea).            Discharge Care Instructions  (From admission, onward)         Start     Ordered   01/26/21 0000  Discharge wound care:       Comments: Follow with hospice   01/26/21 0911           Signed: Sharen Hones 01/26/2021, 9:11 AM

## 2021-01-27 LAB — CULTURE, BLOOD (ROUTINE X 2)
Culture: NO GROWTH
Culture: NO GROWTH
Special Requests: ADEQUATE
Special Requests: ADEQUATE

## 2021-02-03 DEATH — deceased
